# Patient Record
Sex: Female | Born: 1965 | Race: White | Hispanic: Yes | Marital: Single | Smoking: Former smoker
Health system: Southern US, Community
[De-identification: ages and names within clinical notes are randomized; demographics above are authoritative.]

## PROBLEM LIST (undated history)

## (undated) DIAGNOSIS — K2970 Gastritis, unspecified, without bleeding: Secondary | ICD-10-CM

## (undated) HISTORY — PX: CHOLECYSTECTOMY: SHX55

---

## 2010-08-15 ENCOUNTER — Emergency Department (HOSPITAL_COMMUNITY)
Admission: EM | Admit: 2010-08-15 | Discharge: 2010-08-15 | Payer: Self-pay | Source: Home / Self Care | Admitting: Family Medicine

## 2010-09-07 ENCOUNTER — Inpatient Hospital Stay (HOSPITAL_COMMUNITY)
Admission: AD | Admit: 2010-09-07 | Discharge: 2010-09-07 | Payer: Self-pay | Source: Home / Self Care | Attending: Family Medicine | Admitting: Family Medicine

## 2010-09-08 LAB — CBC
HCT: 39.5 % (ref 36.0–46.0)
Hemoglobin: 14 g/dL (ref 12.0–15.0)
MCH: 31.8 pg (ref 26.0–34.0)
MCHC: 35.4 g/dL (ref 30.0–36.0)
MCV: 89.8 fL (ref 78.0–100.0)
Platelets: 222 10*3/uL (ref 150–400)
RBC: 4.4 MIL/uL (ref 3.87–5.11)
RDW: 12.7 % (ref 11.5–15.5)
WBC: 7.9 10*3/uL (ref 4.0–10.5)

## 2010-09-08 LAB — URINE MICROSCOPIC-ADD ON

## 2010-09-08 LAB — URINALYSIS, ROUTINE W REFLEX MICROSCOPIC
Bilirubin Urine: NEGATIVE
Ketones, ur: NEGATIVE mg/dL
Leukocytes, UA: NEGATIVE
Nitrite: NEGATIVE
Protein, ur: NEGATIVE mg/dL
Specific Gravity, Urine: 1.02 (ref 1.005–1.030)
Urine Glucose, Fasting: NEGATIVE mg/dL
Urobilinogen, UA: 0.2 mg/dL (ref 0.0–1.0)
pH: 6 (ref 5.0–8.0)

## 2010-09-08 LAB — WET PREP, GENITAL
Clue Cells Wet Prep HPF POC: NONE SEEN
Trich, Wet Prep: NONE SEEN
Yeast Wet Prep HPF POC: NONE SEEN

## 2010-09-08 LAB — ABO/RH: ABO/RH(D): O POS

## 2010-09-08 LAB — POCT PREGNANCY, URINE: Preg Test, Ur: POSITIVE

## 2010-09-08 LAB — HCG, QUANTITATIVE, PREGNANCY: hCG, Beta Chain, Quant, S: 1539 m[IU]/mL — ABNORMAL HIGH (ref <5)

## 2010-09-09 ENCOUNTER — Inpatient Hospital Stay (HOSPITAL_COMMUNITY)
Admission: AD | Admit: 2010-09-09 | Discharge: 2010-09-09 | Payer: Self-pay | Source: Home / Self Care | Attending: Obstetrics & Gynecology | Admitting: Obstetrics & Gynecology

## 2010-09-10 LAB — GC/CHLAMYDIA PROBE AMP, GENITAL
Chlamydia, DNA Probe: NEGATIVE
GC Probe Amp, Genital: NEGATIVE

## 2010-09-10 LAB — HCG, QUANTITATIVE, PREGNANCY: hCG, Beta Chain, Quant, S: 187 m[IU]/mL — ABNORMAL HIGH (ref <5)

## 2010-09-15 ENCOUNTER — Ambulatory Visit (HOSPITAL_COMMUNITY): Admission: RE | Admit: 2010-09-15 | Payer: Self-pay | Source: Home / Self Care | Admitting: Family Medicine

## 2010-09-24 ENCOUNTER — Encounter: Payer: Self-pay | Admitting: Family Medicine

## 2010-09-25 ENCOUNTER — Encounter: Payer: Self-pay | Admitting: Obstetrics and Gynecology

## 2010-09-25 ENCOUNTER — Ambulatory Visit: Admit: 2010-09-25 | Payer: Self-pay | Admitting: Obstetrics and Gynecology

## 2010-11-22 ENCOUNTER — Inpatient Hospital Stay (INDEPENDENT_AMBULATORY_CARE_PROVIDER_SITE_OTHER)
Admission: RE | Admit: 2010-11-22 | Discharge: 2010-11-22 | Disposition: A | Discharge status: Home / Self Care | Payer: Self-pay | Source: Ambulatory Visit | Attending: Family Medicine | Admitting: Family Medicine

## 2010-11-22 DIAGNOSIS — M549 Dorsalgia, unspecified: Secondary | ICD-10-CM

## 2010-11-22 DIAGNOSIS — J309 Allergic rhinitis, unspecified: Secondary | ICD-10-CM

## 2011-05-28 ENCOUNTER — Inpatient Hospital Stay (HOSPITAL_COMMUNITY)
Admission: AD | Admit: 2011-05-28 | Discharge: 2011-05-28 | Disposition: A | Discharge status: Home / Self Care | Payer: Medicaid Other | Source: Ambulatory Visit | Attending: Obstetrics & Gynecology | Admitting: Obstetrics & Gynecology

## 2011-05-28 ENCOUNTER — Encounter (HOSPITAL_COMMUNITY): Payer: Self-pay | Admitting: *Deleted

## 2011-05-28 DIAGNOSIS — R51 Headache: Secondary | ICD-10-CM | POA: Insufficient documentation

## 2011-05-28 DIAGNOSIS — R1013 Epigastric pain: Secondary | ICD-10-CM | POA: Insufficient documentation

## 2011-05-28 HISTORY — DX: Gastritis, unspecified, without bleeding: K29.70

## 2011-05-28 LAB — COMPREHENSIVE METABOLIC PANEL
ALT: 12 U/L (ref 0–35)
AST: 13 U/L (ref 0–37)
Albumin: 4 g/dL (ref 3.5–5.2)
Alkaline Phosphatase: 86 U/L (ref 39–117)
BUN: 13 mg/dL (ref 6–23)
CO2: 27 mEq/L (ref 19–32)
Calcium: 9.3 mg/dL (ref 8.4–10.5)
Chloride: 100 mEq/L (ref 96–112)
Creatinine, Ser: 0.61 mg/dL (ref 0.50–1.10)
GFR calc Af Amer: >90 mL/min (ref >90)
GFR calc non Af Amer: >90 mL/min (ref >90)
Glucose, Bld: 94 mg/dL (ref 70–99)
Potassium: 4 mEq/L (ref 3.5–5.1)
Sodium: 137 mEq/L (ref 135–145)
Total Bilirubin: 0.3 mg/dL (ref 0.3–1.2)
Total Protein: 6.9 g/dL (ref 6.0–8.3)

## 2011-05-28 LAB — CBC
HCT: 40.8 % (ref 36.0–46.0)
Hemoglobin: 14.5 g/dL (ref 12.0–15.0)
MCH: 31.9 pg (ref 26.0–34.0)
MCHC: 35.5 g/dL (ref 30.0–36.0)
MCV: 89.7 fL (ref 78.0–100.0)
Platelets: 272 10*3/uL (ref 150–400)
RBC: 4.55 MIL/uL (ref 3.87–5.11)
RDW: 13.4 % (ref 11.5–15.5)
WBC: 11 10*3/uL — ABNORMAL HIGH (ref 4.0–10.5)

## 2011-05-28 MED ORDER — RANITIDINE HCL 150 MG PO CAPS: 150.0000 mg | ORAL_CAPSULE | Freq: Two times a day (BID) | ORAL | Status: DC

## 2011-05-28 MED ORDER — AMOXICILLIN 500 MG PO CAPS: 500.0000 mg | ORAL_CAPSULE | Freq: Three times a day (TID) | ORAL | Status: AC

## 2011-05-28 MED ORDER — FAMOTIDINE 20 MG PO TABS
20.0000 mg | ORAL_TABLET | Freq: Once | ORAL | Status: AC
  Administered 2011-05-28: 20 mg via ORAL
  Filled 2011-05-28: qty 1

## 2011-05-28 MED ORDER — OXYCODONE-ACETAMINOPHEN 5-325 MG PO TABS
1.0000 | ORAL_TABLET | Freq: Once | ORAL | Status: AC
  Administered 2011-05-28: 1 via ORAL
  Filled 2011-05-28: qty 1

## 2011-05-28 NOTE — ED Provider Notes (Signed)
History     CSN: [id number] Arrival date & time: 05/28/2011  7:24 PM  Chief Complaint  Patient presents with  . Headache  . Facial Pain    HPI Amber Crawford is a 45 y.o. female who presents to [facility] for headache and nasal congestion that started 3 days ago. She was told in the past that she has seasonal allergies. She also has epigastric pain that has been diagnosed in the past as gastritis and she was given Rx for acid blocker but never got the medication. She came from [place] a year ago and does not have a doctor in [place]. She has taken tylenol for her headache. Rates pain 7/10.  The history was provided by the patient using the hospital translator.  Past Medical History  Diagnosis Date  . Gastritis     Past Surgical History  Procedure Date  . Cesarean section     No family history on file.  History  Substance Use Topics  . Smoking status: Current Everyday Smoker -- 0.5 packs/day    Types: Cigarettes  . Smokeless tobacco: Not on file  . Alcohol Use: No    OB History    Grav Para Term Preterm Abortions TAB SAB Ect Mult Living   5    1 1    4       Review of Systems  Constitutional: Negative for fever, chills, diaphoresis and fatigue.  HENT: Positive for ear pain, congestion, sneezing and sinus pressure. Negative for sore throat, facial swelling, neck pain, neck stiffness and dental problem.   Eyes: Negative for photophobia, pain and discharge.  Respiratory: Negative for cough, chest tightness and wheezing.   Cardiovascular: Negative.   Gastrointestinal: Positive for abdominal pain. Negative for nausea, vomiting, diarrhea and constipation.  Genitourinary: Negative for dysuria, frequency, flank pain, vaginal bleeding, vaginal discharge, difficulty urinating, vaginal pain and pelvic pain.  Musculoskeletal: Negative for myalgias, back pain and gait problem.  Skin: Negative for color change and rash.  Neurological: Positive for light-headedness and headaches.  Negative for dizziness, speech difficulty, weakness and numbness.  Psychiatric/Behavioral: Negative for confusion and agitation. The patient is not nervous/anxious.     Allergies  Aspirin  Home Medications  No current outpatient prescriptions on file.  BP 139/87  Pulse 80  Temp(Src) 98.5 F (36.9 C) (Oral)  Resp 18  Ht 5' (1.524 m)  Wt 142 lb 8 oz (64.638 kg)  BMI 27.83 kg/m2  LMP 05/25/2011  Physical Exam  Nursing note and vitals reviewed. Constitutional: She is oriented to person, place, and time. She appears well-developed and well-nourished. No distress.  HENT:  Head: Normocephalic and atraumatic.  Right Ear: Tympanic membrane, external ear and ear canal normal.  Left Ear: Tympanic membrane, external ear and ear canal normal.  Nose: Right sinus exhibits maxillary sinus tenderness and frontal sinus tenderness.  Eyes: Conjunctivae and EOM are normal. Pupils are equal, round, and reactive to light.  Neck: Normal range of motion. Neck supple.  Cardiovascular: Normal rate and regular rhythm.   Pulmonary/Chest: Effort normal and breath sounds normal.  Abdominal: Soft. There is tenderness in the epigastric area. There is no rebound and no guarding.  Musculoskeletal: Normal range of motion.  Lymphadenopathy:    She has no cervical adenopathy.  Neurological: She is alert and oriented to person, place, and time. She has normal strength and normal reflexes. No cranial nerve deficit or sensory deficit. She displays a negative Romberg sign. Coordination and gait normal.  Skin: Skin is  warm and dry.  Psychiatric: She has a normal mood and affect.   Results for orders placed during the hospital encounter of 05/28/11 (from the past 24 hour(s))  CBC     Status: Abnormal   Collection Time   05/28/11  9:55 PM      Component Value Range   WBC 11.0 (*) 4.0 - 10.5 (K/uL)   RBC 4.55  3.87 - 5.11 (MIL/uL)   Hemoglobin 14.5  12.0 - 15.0 (g/dL)   HCT 86.5  78.4 - 69.6 (%)   MCV 89.7  78.0 -  100.0 (fL)   MCH 31.9  26.0 - 34.0 (pg)   MCHC 35.5  30.0 - 36.0 (g/dL)   RDW 29.5  28.4 - 13.2 (%)   Platelets 272  150 - 400 (K/uL)  COMPREHENSIVE METABOLIC PANEL     Status: Normal   Collection Time   05/28/11  9:55 PM      Component Value Range   Sodium 137  135 - 145 (mEq/L)   Potassium 4.0  3.5 - 5.1 (mEq/L)   Chloride 100  96 - 112 (mEq/L)   CO2 27  19 - 32 (mEq/L)   Glucose, Bld 94  70 - 99 (mg/dL)   BUN 13  6 - 23 (mg/dL)   Creatinine, Ser 4.40  0.50 - 1.10 (mg/dL)   Calcium 9.3  8.4 - 10.2 (mg/dL)   Total Protein 6.9  6.0 - 8.3 (g/dL)   Albumin 4.0  3.5 - 5.2 (g/dL)   AST 13  0 - 37 (U/L)   ALT 12  0 - 35 (U/L)   Alkaline Phosphatase 86  39 - 117 (U/L)   Total Bilirubin 0.3  0.3 - 1.2 (mg/dL)   GFR calc non Af Amer >90  >90 (mL/min)   GFR calc Af Amer >90  >90 (mL/min)   Assessment:  Sinus headache   Epigastric pain  Plan:  Percocet 5/325 mg. X 1 now.   Amoxicillin 500 mg. Po tid x 7 days   Pepcid 20 mg. Po now   Discussed with patient that if she continues to have problems she will need to go to [facility] or [facility] ED for      evaluation of problems other than OB/GYN.     ED Course: headache improved will d/c home.  Procedures    MDM          Kerrie Buffalo, NP 05/28/11 2307

## 2011-05-28 NOTE — Progress Notes (Signed)
Pt presents with complaints of Headache and sinus pain. Pt has a hx of sinus pain. No vaginal complaints.

## 2011-05-28 NOTE — Progress Notes (Signed)
WITH MADAY- INTERPRETER-  SAYS HEAD STARTED  HURTING X3 MTHS BEFORE CYCLE STARTS - ALSO HER EYES- PRESSURE, LOWER BACK  AND BREAST HURT.  WHEN HER CYCLE IS FINISHED - ALL SYM STOPS  EXCEPT  THIS TIME HER HEAD AND NOW  AND SINUSES STILL HUIRT.     ALSO  UPPER ABD HURTS.  TOLD BY DR  IN [place].- SHE HAS GASTRITIS.     [place]- HAS NOT SEEN ANY DR.

## 2011-06-02 NOTE — ED Provider Notes (Signed)
Attestation of Attending Supervision of Advanced Practitioner: Evaluation and management procedures were performed by the PA/NP/CNM/OB Fellow under my supervision/collaboration. Chart reviewed and agree with management and plan.  Elodia Haviland A 06/02/2011 1:20 PM   

## 2011-07-17 ENCOUNTER — Emergency Department (HOSPITAL_COMMUNITY)
Admission: EM | Admit: 2011-07-17 | Discharge: 2011-07-17 | Disposition: A | Discharge status: Home / Self Care | Payer: Medicaid Other | Source: Home / Self Care | Attending: Emergency Medicine | Admitting: Emergency Medicine

## 2011-07-17 ENCOUNTER — Encounter (HOSPITAL_COMMUNITY): Payer: Self-pay | Admitting: *Deleted

## 2011-07-17 DIAGNOSIS — M542 Cervicalgia: Secondary | ICD-10-CM

## 2011-07-17 DIAGNOSIS — J329 Chronic sinusitis, unspecified: Secondary | ICD-10-CM

## 2011-07-17 MED ORDER — CYCLOBENZAPRINE HCL 10 MG PO TABS: 10.0000 mg | ORAL_TABLET | Freq: Three times a day (TID) | ORAL | Status: AC | PRN

## 2011-07-17 MED ORDER — FLUTICASONE PROPIONATE 50 MCG/ACT NA SUSP: 2.0000 | Freq: Every day | NASAL | Status: DC

## 2011-07-17 MED ORDER — DOXYCYCLINE HYCLATE 100 MG PO CAPS: 100.0000 mg | ORAL_CAPSULE | Freq: Two times a day (BID) | ORAL | Status: AC

## 2011-07-17 MED ORDER — IBUPROFEN 600 MG PO TABS: 600.0000 mg | ORAL_TABLET | Freq: Four times a day (QID) | ORAL | Status: AC | PRN

## 2011-07-17 NOTE — ED Notes (Signed)
C/O facial pain and pressure w/ HA and neck pain since October; worse over past 2 wks.  Was given Rx for sinusitis 11/4, but was unable to get filled at that time.  C/O nausea, aching in upper back and entire neck.  Denies sore throat.  C/O sneezing and left earache.  Denies fevers.  C/O body aches and fatigue.  Denies cough.  C/O slight runny nose.  Has been taking Tyl or IBU w/ slight relief of HA.

## 2011-07-17 NOTE — ED Provider Notes (Signed)
History     CSN: [id number] Arrival date & time: 07/17/2011  6:21 PM   First MD Initiated Contact with Patient 07/17/11 1541      Chief Complaint  Patient presents with  . Facial Pain  . Headache  . Neck Pain   HPI Comments: Pt c/o 2 weeks worsening daily constant frontal sinus pressure/pain worse with bending forward, placing warm compresses on her face. Reports nasal congestion, postnasal drip, fatigue. No nasal discharge, sore throat. Also with sneezing, itchy watery eyes, left ear pain. No change in hearing, otorrhea. No dental pain, facial swelling. No N/V, fevers, photophobia, focal neurological deficits. Seen for this at [facility] on 10/4 sent home on amoxicillin but was unable to fill this. States sx never resolved.  Also c/o bilateral neck and upper back "soreness" during this time, worse with use, certain head positions. No recent/remote h/o trauma to neck, weakness/numbness of arms, parasthesias, rashes. States pain identical to pain she was seen for in march and thought to have MSK pain.   Patient is a 45 y.o. female presenting with sinusitis.  Sinusitis  This is a recurrent problem. The current episode started more than 1 week ago. The problem has been gradually worsening. There has been no fever. Associated symptoms include congestion, ear pain and sinus pressure. Pertinent negatives include no chills, no sore throat and no cough. She has tried acetaminophen (nsaids) for the symptoms. The treatment provided mild relief.    Past Medical History  Diagnosis Date  . Gastritis     Past Surgical History  Procedure Date  . Cesarean section     History reviewed. No pertinent family history.  History  Substance Use Topics  . Smoking status: Current Everyday Smoker -- 0.5 packs/day    Types: Cigarettes  . Smokeless tobacco: Not on file  . Alcohol Use: No    OB History    Grav Para Term Preterm Abortions TAB SAB Ect Mult Living   5    1 1    4       Review of Systems    Constitutional: Negative for fever and chills.  HENT: Positive for ear pain, congestion, sneezing, neck pain, postnasal drip and sinus pressure. Negative for hearing loss, sore throat, facial swelling, trouble swallowing, neck stiffness, dental problem, voice change and ear discharge.   Respiratory: Negative for cough and wheezing.   Cardiovascular: Negative for chest pain.  Musculoskeletal: Positive for myalgias. Negative for back pain.  Skin: Negative for wound.  Neurological: Positive for headaches. Negative for weakness.    Allergies  Aspirin  Home Medications   Current Outpatient Rx  Name Route Sig Dispense Refill  . CYCLOBENZAPRINE HCL 10 MG PO TABS Oral Take 1 tablet (10 mg total) by mouth 3 (three) times daily as needed for muscle spasms. 20 tablet 0  . DOXYCYCLINE HYCLATE 100 MG PO CAPS Oral Take 1 capsule (100 mg total) by mouth 2 (two) times daily. X 10 days 20 capsule 0  . FLUTICASONE PROPIONATE 50 MCG/ACT NA SUSP Nasal Place 2 sprays into the nose daily. 16 g 0  . IBUPROFEN 600 MG PO TABS Oral Take 1 tablet (600 mg total) by mouth every 6 (six) hours as needed for pain. 30 tablet 0    BP 135/67  Pulse 75  Temp(Src) 98.1 F (36.7 C) (Oral)  Resp 16  SpO2 98%  LMP 07/16/2011  Physical Exam  Nursing note and vitals reviewed. Constitutional: She is oriented to person, place, and time. She appears  well-developed and well-nourished. No distress.  HENT:  Head: Normocephalic and atraumatic.  Right Ear: Tympanic membrane normal.  Left Ear: Tympanic membrane normal.  Nose: Mucosal edema and rhinorrhea present. Right sinus exhibits maxillary sinus tenderness and frontal sinus tenderness. Left sinus exhibits maxillary sinus tenderness and frontal sinus tenderness.  Mouth/Throat: Uvula is midline, oropharynx is clear and moist and mucous membranes are normal.       Several upper, lower dental caries. No tooth tenderness, gingvial swelling.   Eyes: Conjunctivae and EOM are  normal. Pupils are equal, round, and reactive to light.  Neck: Normal range of motion.  Cardiovascular: Normal rate, regular rhythm and normal heart sounds.   Pulmonary/Chest: Effort normal and breath sounds normal.  Abdominal: She exhibits no distension.  Musculoskeletal: Normal range of motion.       Tenderness bilateral trapezial muscles, upper back. No c- or t-spine tenderness. UE with FROM, no tenderness in shoulders, sensation to LT intact, strength 5/5 bilaterally, RP 2+.  Lymphadenopathy:    She has no cervical adenopathy.  Neurological: She is alert and oriented to person, place, and time. She has normal strength. No cranial nerve deficit or sensory deficit.       Gait steady.   Skin: Skin is warm and dry. No rash noted.  Psychiatric: She has a normal mood and affect. Her behavior is normal. Judgment and thought content normal.    ED Course  Procedures (including critical care time)  Labs Reviewed - No data to display No results found.   1. Sinusitis   2. Neck pain, musculoskeletal       MDM  Pt with daily constant frontal HA c/w prev sinus HA. no sudden onset. Doubt SAH, ICH or space occupying lesion. Pt without fevers/chills, Pt has no meningeal sx, no nuchal rigidity. Doubt meningitis. Pt with normal neuro exam, no evidence of CVA/TIA.  Pt BP not elevated significantly, doubt hypertensive emergency. No evidence of temporal artery tenderness, no evidence of glaucoma or other occular pathology. Appears to be most c/w sinusitis- as had has sx for 7 weeks will tx with abx. Discussed importance of filling them and taking them as written. Neck/upper back pain appears to be muscle strain/spasm. Neuro exam WNL. Will have f/u with PMD of choice. Advised when to return to ED.      Luiz Blare, MD 07/17/11 [phone]

## 2012-02-13 ENCOUNTER — Emergency Department (HOSPITAL_COMMUNITY)
Admission: EM | Admit: 2012-02-13 | Discharge: 2012-02-13 | Disposition: A | Discharge status: Home / Self Care | Payer: Medicaid Other | Attending: Emergency Medicine | Admitting: Emergency Medicine

## 2012-02-13 ENCOUNTER — Emergency Department (HOSPITAL_COMMUNITY): Payer: Medicaid Other

## 2012-02-13 ENCOUNTER — Encounter (HOSPITAL_COMMUNITY): Payer: Self-pay | Admitting: *Deleted

## 2012-02-13 DIAGNOSIS — R059 Cough, unspecified: Secondary | ICD-10-CM | POA: Insufficient documentation

## 2012-02-13 DIAGNOSIS — R05 Cough: Secondary | ICD-10-CM | POA: Insufficient documentation

## 2012-02-13 DIAGNOSIS — R0602 Shortness of breath: Secondary | ICD-10-CM | POA: Insufficient documentation

## 2012-02-13 DIAGNOSIS — F17200 Nicotine dependence, unspecified, uncomplicated: Secondary | ICD-10-CM | POA: Insufficient documentation

## 2012-02-13 DIAGNOSIS — Z79899 Other long term (current) drug therapy: Secondary | ICD-10-CM | POA: Insufficient documentation

## 2012-02-13 DIAGNOSIS — J40 Bronchitis, not specified as acute or chronic: Secondary | ICD-10-CM

## 2012-02-13 MED ORDER — ALBUTEROL SULFATE (5 MG/ML) 0.5% IN NEBU
5.0000 mg | INHALATION_SOLUTION | Freq: Once | RESPIRATORY_TRACT | Status: AC
  Administered 2012-02-13: 5 mg via RESPIRATORY_TRACT
  Filled 2012-02-13: qty 1

## 2012-02-13 MED ORDER — ALBUTEROL SULFATE HFA 108 (90 BASE) MCG/ACT IN AERS
2.0000 | INHALATION_SPRAY | RESPIRATORY_TRACT | Status: DC | PRN
  Administered 2012-02-13: 2 via RESPIRATORY_TRACT
  Filled 2012-02-13: qty 6.7

## 2012-02-13 MED ORDER — DEXAMETHASONE SODIUM PHOSPHATE 10 MG/ML IJ SOLN
10.0000 mg | Freq: Once | INTRAMUSCULAR | Status: DC
  Filled 2012-02-13: qty 1

## 2012-02-13 MED ORDER — IPRATROPIUM BROMIDE 0.02 % IN SOLN
0.5000 mg | Freq: Once | RESPIRATORY_TRACT | Status: AC
  Administered 2012-02-13: 0.5 mg via RESPIRATORY_TRACT
  Filled 2012-02-13: qty 2.5

## 2012-02-13 MED ORDER — DEXAMETHASONE SODIUM PHOSPHATE 10 MG/ML IJ SOLN
10.0000 mg | Freq: Once | INTRAMUSCULAR | Status: AC
  Administered 2012-02-13: 10 mg via INTRAMUSCULAR

## 2012-02-13 NOTE — ED Provider Notes (Signed)
History     CSN: [id number]  Arrival date & time 02/13/12  0031   First MD Initiated Contact with Patient 02/13/12 0254      Chief Complaint  Patient presents with  . Shortness of Breath    (Consider location/radiation/quality/duration/timing/severity/associated sxs/prior treatment) HPI This is a 46 year old Hispanic female with a two-week history of dry cough, wheezing and shortness of breath. The symptoms are moderate and persistent. She was treated with Zithromax and Phenergan with codeine without adequate relief. Her symptoms are exacerbated by exertion such as when she is cleaning the house. She was given albuterol breathing treatment prior to my evaluation with significant improvement in her symptoms. She denies fever, nausea, vomiting or diarrhea. He denies chest pain.   Past Medical History  Diagnosis Date  . Gastritis     Past Surgical History  Procedure Date  . Cesarean section     History reviewed. No pertinent family history.  History  Substance Use Topics  . Smoking status: Current Everyday Smoker -- 0.5 packs/day    Types: Cigarettes  . Smokeless tobacco: Not on file  . Alcohol Use: No    OB History    Grav Para Term Preterm Abortions TAB SAB Ect Mult Living   5    1 1    4       Review of Systems  All other systems reviewed and are negative.    Allergies  Aspirin  Home Medications   Current Outpatient Rx  Name Route Sig Dispense Refill  . AZITHROMYCIN 500 MG PO TABS Oral Take 500 mg by mouth daily. For 5 days. Started 02/10/12    . IBUPROFEN 200 MG PO TABS Oral Take 600 mg by mouth every 8 (eight) hours as needed. For pain    . OMEGA-3-ACID ETHYL ESTERS 1 G PO CAPS Oral Take 1 g by mouth daily.    Marland Kitchen PRAVASTATIN SODIUM 20 MG PO TABS Oral Take 20 mg by mouth daily.    Marland Kitchen PROMETHAZINE-CODEINE 6.25-10 MG/5ML PO SYRP Oral Take 5 mLs by mouth 3 (three) times daily as needed.     Marland Kitchen FLUTICASONE PROPIONATE 50 MCG/ACT NA SUSP Nasal Place 2 sprays into  the nose daily. 16 g 0    BP 123/73  Pulse 76  Temp 98.1 F (36.7 C) (Oral)  Resp 17  Wt 135 lb (61.236 kg)  SpO2 95%  LMP 02/08/2012  Physical Exam General: Well-developed, well-nourished female in no acute distress; appearance consistent with age of record HENT: normocephalic, atraumatic Eyes: pupils equal round and reactive to light; extraocular muscles intact Neck: supple Heart: regular rate and rhythm Lungs: Expiratory wheezing bilaterally Abdomen: soft; nondistended Extremities: No deformity; full range of motion Neurologic: Awake, alert and oriented; motor function intact in all extremities and symmetric; no facial droop Skin: Warm and dry Psychiatric: Normal mood and affect    ED Course  Procedures (including critical care time)     MDM   Nursing notes and vitals signs, including pulse oximetry, reviewed.  Summary of this visit's results, reviewed by myself:  Imaging Studies: Dg Chest 2 View  02/13/2012  *RADIOLOGY REPORT*  Clinical Data: Shortness of breath, cough, congestion and wheezing; history of smoking.  CHEST - 2 VIEW  Comparison: None.  Findings: The lungs are well-aerated.  Mild peribronchial thickening is noted.  There is no evidence of focal opacification, pleural effusion or pneumothorax.  The heart is normal in size; the mediastinal contour is within normal limits.  No acute osseous abnormalities  are seen.  Clips are noted within the right upper quadrant, reflecting prior cholecystectomy.  IMPRESSION: Mild peribronchial thickening noted; lungs otherwise clear.  Original Report Authenticated By: Tonia Ghent, M.D.            Hanley Seamen, MD 02/13/12 [phone]

## 2012-02-13 NOTE — ED Notes (Signed)
Pt c/o s/s x 2 weeks. Pt presents w/ dry cough, audible inspiratory wheeze

## 2012-02-13 NOTE — ED Notes (Signed)
Pt seen this week for cough, rx'd Zpak, last dose due tomorrow per EMS. Pt c/o cough and "trouble breathing". Pt has taken rx'd cough medication today w/o relief. Pt is taking promethazine/codeine syrup.

## 2012-02-13 NOTE — ED Notes (Signed)
Patient is alert and oriented x3.  She was given DC instructions and follow up visit instructions.  Patient gave verbal understanding. She was DC ambulatory under his own power to home.  V/S stable.  He was not showing any signs of distress on DC 

## 2014-06-25 ENCOUNTER — Encounter (HOSPITAL_COMMUNITY): Payer: Self-pay | Admitting: *Deleted

## 2015-06-27 ENCOUNTER — Emergency Department (HOSPITAL_COMMUNITY)
Admission: EM | Admit: 2015-06-27 | Discharge: 2015-06-28 | Disposition: A | Discharge status: Home / Self Care | Payer: Medicaid Other | Attending: Emergency Medicine | Admitting: Emergency Medicine

## 2015-06-27 ENCOUNTER — Encounter (HOSPITAL_COMMUNITY): Payer: Self-pay | Admitting: Emergency Medicine

## 2015-06-27 DIAGNOSIS — R42 Dizziness and giddiness: Secondary | ICD-10-CM | POA: Insufficient documentation

## 2015-06-27 DIAGNOSIS — Z7951 Long term (current) use of inhaled steroids: Secondary | ICD-10-CM | POA: Insufficient documentation

## 2015-06-27 DIAGNOSIS — R110 Nausea: Secondary | ICD-10-CM | POA: Diagnosis not present

## 2015-06-27 DIAGNOSIS — Z79899 Other long term (current) drug therapy: Secondary | ICD-10-CM | POA: Diagnosis not present

## 2015-06-27 DIAGNOSIS — Z792 Long term (current) use of antibiotics: Secondary | ICD-10-CM | POA: Insufficient documentation

## 2015-06-27 DIAGNOSIS — M79641 Pain in right hand: Secondary | ICD-10-CM | POA: Diagnosis not present

## 2015-06-27 DIAGNOSIS — Z8719 Personal history of other diseases of the digestive system: Secondary | ICD-10-CM | POA: Diagnosis not present

## 2015-06-27 DIAGNOSIS — R531 Weakness: Secondary | ICD-10-CM | POA: Insufficient documentation

## 2015-06-27 LAB — URINALYSIS, ROUTINE W REFLEX MICROSCOPIC
Bilirubin Urine: NEGATIVE
Glucose, UA: NEGATIVE mg/dL
Hgb urine dipstick: NEGATIVE
Ketones, ur: NEGATIVE mg/dL
LEUKOCYTES UA: NEGATIVE
NITRITE: NEGATIVE
Protein, ur: NEGATIVE mg/dL
SPECIFIC GRAVITY, URINE: 1.02 (ref 1.005–1.030)
UROBILINOGEN UA: 1 mg/dL (ref 0.0–1.0)
pH: 7.5 (ref 5.0–8.0)

## 2015-06-27 LAB — BASIC METABOLIC PANEL
ANION GAP: 12 (ref 5–15)
BUN: 13 mg/dL (ref 6–20)
CO2: 23 mmol/L (ref 22–32)
Calcium: 8.9 mg/dL (ref 8.9–10.3)
Chloride: 101 mmol/L (ref 101–111)
Creatinine, Ser: 0.6 mg/dL (ref 0.44–1.00)
GFR calc Af Amer: >60 mL/min (ref >60)
GLUCOSE: 95 mg/dL (ref 65–99)
POTASSIUM: 4.1 mmol/L (ref 3.5–5.1)
Sodium: 136 mmol/L (ref 135–145)

## 2015-06-27 LAB — CBC
HEMATOCRIT: 38.3 % (ref 36.0–46.0)
HEMOGLOBIN: 13.2 g/dL (ref 12.0–15.0)
MCH: 31.4 pg (ref 26.0–34.0)
MCHC: 34.5 g/dL (ref 30.0–36.0)
MCV: 91.2 fL (ref 78.0–100.0)
Platelets: 250 10*3/uL (ref 150–400)
RBC: 4.2 MIL/uL (ref 3.87–5.11)
RDW: 13 % (ref 11.5–15.5)
WBC: 9.4 10*3/uL (ref 4.0–10.5)

## 2015-06-27 MED ORDER — METOCLOPRAMIDE HCL 10 MG PO TABS
10.0000 mg | ORAL_TABLET | Freq: Once | ORAL | Status: DC
Start: 2015-06-27 — End: 2015-06-28

## 2015-06-27 MED ORDER — MECLIZINE HCL 25 MG PO TABS
50.0000 mg | ORAL_TABLET | Freq: Once | ORAL | Status: AC
Start: 2015-06-27 — End: 2015-06-28
  Administered 2015-06-28: 50 mg via ORAL
  Filled 2015-06-27: qty 2

## 2015-06-27 MED ORDER — IBUPROFEN 400 MG PO TABS
600.0000 mg | ORAL_TABLET | Freq: Once | ORAL | Status: DC
  Filled 2015-06-27 (×2): qty 1

## 2015-06-27 NOTE — ED Notes (Signed)
Pt states for the past 5 days she has been dizzy and having intermittent double vision. No double vision at this time but it comes and goes. Pt also has anterior headache for past 5 days. Pt deines any chest pain. Pt is alert and ox4. No neuro deficits noted.

## 2015-06-27 NOTE — ED Notes (Signed)
Pt states LOC approx 2 day ago.

## 2015-06-27 NOTE — ED Provider Notes (Signed)
CSN: [id number]   Arrival date & time 06/27/15 1821  History  By signing my name below, I, Bethel Born, attest that this documentation has been prepared under the direction and in the presence of Tomasita Crumble, MD. Electronically Signed: Bethel Born, ED Scribe. 06/27/2015. 11:56 PM.  Chief Complaint  Patient presents with  . Dizziness    HPI The history is provided by the patient. A language interpreter was used (#[id number]).   Amber Crawford is a 49 y.o. female who presents to the Emergency Department complaining of new and intermittent dizziness with onset 2 weeks ago. Turning the head from side to side exacerbates the sensation. Associated symptoms include generalized weakness, and nausea. Pt denies focal weakness, fever, headache, cough, chest pain, vomiting, diarrhea, abdominal pain. She also complains of shooting pain in her right hand for 8 months that inhibits her ability to work.  Pt speaks a small amount of English. History obtained with the use of a Spanish tele-interpreter.    Past Medical History  Diagnosis Date  . Gastritis     Past Surgical History  Procedure Laterality Date  . Cesarean section      No family history on file.  Social History  Substance Use Topics  . Smoking status: Current Every Day Smoker -- 0.50 packs/day    Types: Cigarettes  . Smokeless tobacco: None  . Alcohol Use: No     Review of Systems  Constitutional: Negative for fever and chills.  Respiratory: Negative for cough.   Gastrointestinal: Positive for nausea. Negative for vomiting and diarrhea.  Musculoskeletal:       Right hand pain  Neurological: Positive for dizziness and weakness (generalized). Negative for speech difficulty, numbness and headaches.  10 Systems reviewed and all are negative for acute change except as noted in the HPI. Home Medications   Prior to Admission medications   Medication Sig Start Date End Date Taking? Authorizing Provider  azithromycin  (ZITHROMAX) 500 MG tablet Take 500 mg by mouth daily. For 5 days. Started 02/10/12    Historical Provider, MD  fluticasone (FLONASE) 50 MCG/ACT nasal spray Place 2 sprays into the nose daily. 07/17/11 07/16/12  Domenick Gong, MD  ibuprofen (ADVIL,MOTRIN) 200 MG tablet Take 600 mg by mouth every 8 (eight) hours as needed. For pain    Historical Provider, MD  omega-3 acid ethyl esters (LOVAZA) 1 G capsule Take 1 g by mouth daily.    Historical Provider, MD  pravastatin (PRAVACHOL) 20 MG tablet Take 20 mg by mouth daily.    Historical Provider, MD  promethazine-codeine (PHENERGAN WITH CODEINE) 6.25-10 MG/5ML syrup Take 5 mLs by mouth 3 (three) times daily as needed.     Historical Provider, MD    Allergies  Aspirin  Triage Vitals: BP 100/86 mmHg  Pulse 77  Temp(Src) 98.2 F (36.8 C) (Oral)  Resp 16  Ht  (1.651 m)  Wt 135 lb (61.236 kg)  BMI 22.47 kg/m2  SpO2 100%  LMP 06/25/2015  Physical Exam  Constitutional: She is oriented to person, place, and time. She appears well-developed and well-nourished. No distress.  HENT:  Head: Normocephalic and atraumatic.  Nose: Nose normal.  Mouth/Throat: Oropharynx is clear and moist. No oropharyngeal exudate.  Eyes: Conjunctivae and EOM are normal. Pupils are equal, round, and reactive to light. No scleral icterus.  Neck: Normal range of motion. Neck supple. No JVD present. No tracheal deviation present. No thyromegaly present.  Cardiovascular: Normal rate, regular rhythm and normal heart sounds.  Exam reveals no gallop and no friction rub.   No murmur heard. Pulmonary/Chest: Effort normal and breath sounds normal. No respiratory distress. She has no wheezes. She exhibits no tenderness.  Abdominal: Soft. Bowel sounds are normal. She exhibits no distension and no mass. There is no tenderness. There is no rebound and no guarding.  Musculoskeletal: Normal range of motion. She exhibits no edema or tenderness.  Lymphadenopathy:    She has no  cervical adenopathy.  Neurological: She is alert and oriented to person, place, and time. No cranial nerve deficit. She exhibits normal muscle tone.  Normal strength and sensation in all extremities. Normal cerebellar testing.  Skin: Skin is warm and dry. No rash noted. No erythema. No pallor.  Nursing note and vitals reviewed.   ED Course  Procedures   DIAGNOSTIC STUDIES: Oxygen Saturation is 100% on RA, normal by my interpretation.    COORDINATION OF CARE: 11:29 PM Discussed treatment plan which includes lab work, Reglan, and meclizine with pt at bedside and pt agreed to plan.  Labs Reviewed  URINALYSIS, ROUTINE W REFLEX MICROSCOPIC (NOT AT [facility]) - Abnormal; Notable for the following:    APPearance CLOUDY (*)    All other components within normal limits  BASIC METABOLIC PANEL  CBC  HCG, SERUM, QUALITATIVE  CBG MONITORING, ED    Imaging Review No results found.  I personally reviewed and evaluated these lab results as a part of my medical decision-making.   EKG Interpretation  Date/Time:    Ventricular Rate:    PR Interval:    QRS Duration:   QT Interval:    QTC Calculation:   R Axis:     Text Interpretation:      MDM   Final diagnoses:  None   Patient presents to the ED for dizziness that appears to be worse with moving her head.  It has been going on for 2 weeks.  History is consistent with vertigo and not consistent with posterior stroke.  She was given toradol, reglan, and meclizine for treatment.  Advised on Epley maneuver for home.  She demonstrates understanding, appears well and is in no NAD.  VS remain within her normal limits and she is safe for DC.  I personally performed the services described in this documentation, which was scribed in my presence. The recorded information has been reviewed and is accurate.      Tomasita CrumbleAdeleke Haru Shaff, MD 06/28/15 [phone]

## 2015-06-28 LAB — HCG, SERUM, QUALITATIVE: PREG SERUM: NEGATIVE

## 2015-06-28 MED ORDER — METOCLOPRAMIDE HCL 5 MG/ML IJ SOLN
10.0000 mg | Freq: Once | INTRAMUSCULAR | Status: AC
  Administered 2015-06-28: 10 mg via INTRAVENOUS
  Filled 2015-06-28: qty 2

## 2015-06-28 MED ORDER — KETOROLAC TROMETHAMINE 30 MG/ML IJ SOLN
30.0000 mg | Freq: Once | INTRAMUSCULAR | Status: AC
  Administered 2015-06-28: 30 mg via INTRAVENOUS
  Filled 2015-06-28: qty 1

## 2015-06-28 MED ORDER — METOCLOPRAMIDE HCL 10 MG PO TABS: 10.0000 mg | ORAL_TABLET | Freq: Three times a day (TID) | ORAL | Status: DC | PRN

## 2015-06-28 NOTE — Discharge Instructions (Signed)
Maniobra de Epley, cuidado personal Merchandiser, retail Self-Care) Amber Crawford, your blood work today is normal.  You can use the epley maneuver at home to help with your symptoms.  You can search for a video on the internet.  Take reglan as well for symptoms.  See a primary care doctor within 3 days for close follow up.  If any symptoms worsen, come back to the ED immediately. Thank you.   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 Puede utilizar la [place] de Epley en casa para ayudar con sus sntomas. Puede buscar un vdeo en Internet. Tome Reglan, as como para los sntomas. Consulte a un mdico de atencin primaria dentro de los [address]  [address] de un seguimiento Museum/gallery exhibitions officer. Si los sntomas empeoran, volver al servicio de urgencias inmediatamente. Gracias.  QU ES LA MANIOBRA DE EPLEY? La [employer] de Epley es un ejercicio que puede Education officer, environmental para Paramedic los sntomas del vrtigo posicional paroxstico benigno (VPPB). Esta afeccin a menudo se conoce como vrtigo. El movimiento de unos pequeos cristales (canalculos) dentro del odo interno ocasiona el VPPB. La acumulacin y el movimiento de los canalculos en el odo interno ocasionan una repentina sensacin de aceleracin (vrtigo) cuando se mueve la cabeza en ciertas posiciones. El vrtigo por lo general dura unos 30das. El VPPB normalmente ocurre solo en un odo. Si siente vrtigo cuando se recuesta sobre el lado izquierdo, probablemente tenga VPPB en el odo izquierdo. El mdico le puede decir qu odo est involucrado.  Una lesin en la cabeza puede ocasionar el VPPB. Muchas personas de ms de 50aos tienen VPPB por motivos desconocidos. Si se le diagnostic VPPB, el mdico puede ensearle a Firefighter. El VPPB no es potencialmente mortal (benigno) y normalmente se pasa con el tiempo.  CUNDO DEBO REALIZAR LA MANIOBRA DE EPLEY? Puede realizar [employer] en su casa cuando tenga los sntomas de vrtigo. Puede  realizar Musician de Epley hasta 3veces en un da hasta que los sntomas de vrtigo desaparezcan. CMO DEBO REALIZAR LA MANIOBRA DE EPLEY? 1. Sintese en el borde de una cama o una mesa con la espalda recta. Las piernas deben estar extendidas o colgando sobre el borde de la cama o la mesa. 2. Gire la cabeza a medias hacia el lado del odo afectado. 3. Recustese hacia atrs con la cabeza girada hasta que se encuentre recostado sobre la espalda. Quizs quiera colocar una almohada debajo de los hombros. 4. Mantenga esta posicin durante 30segundos. Es posible que experimente un ataque de vrtigo. Esto es normal. Mantenga esta posicin hasta que el vrtigo desaparezca. 5. Luego gire la cabeza en direccin opuesta hasta que el odo no afectado est orientado al suelo. 6. Mantenga esta posicin durante 30segundos. Es posible que experimente un ataque de vrtigo. Esto es normal. Mantenga esta posicin hasta que el vrtigo desaparezca. 7. Ahora gire todo el cuerpo hacia el mismo lado que la cabeza. Mantenga esta posicin durante otros 30segundos. 8. Luego, vuelva a sentarse. ESTA MANIOBRA PRESENTA RIESGOS? En algunos casos, puede tener otros sntomas (como cambios en la visin, debilidad o entumecimiento). Si tiene estos sntomas, deje de Clinical cytogeneticist y llame al mdico. Baron Sane si realizar esta maniobra lo [place] del vrtigo, es posible que sienta mareos. El mareo es la sensacin de desvanecimiento pero sin la sensacin de [place]. Aunque la [employer] de Energy manager lo [place] del vrtigo, es posible que los sntomas vuelvan durante los siguientes 5aos. QU DEBO HACER DESPUS DE ESTA MANIOBRA? Puede  retomar sus actividades normales despus de Education officer, environmentalrealizar la [place] View Gardensmaniobra de Energy managerpley. Pregntele al mdico si debe hacer algo en su casa para prevenir el vrtigo. Esta puede incluir:  Dormir con dos o ms almohadas para Pharmacologistmantener la cabeza elevada.  No dormir sobre el lado del odo afectado.  Levantarse  lentamente de la cama.  Evitar los movimientos repentinos Administratordurante el da.  Evitar los movimientos de cabeza intensos, como mirar hacia arriba o Public librarianagacharse.  Utilizar un collar cervical para evitar los movimientos de cabeza repentinos. QU DEBO HACER SI LOS SNTOMAS EMPEORAN? Llame al mdico si el vrtigo empeora. Llame al mdico inmediatamente si tiene otros sntomas, incluidos:   Nuseas.  Vmitos.  Dolor de [place]istancabeza.  Debilidad.  Entumecimiento.  Cambios en la visin.   Esta informacin no tiene Theme park managercomo fin reemplazar el consejo del mdico. Asegrese de hacerle al mdico cualquier pregunta que tenga.   Document Released: 08/15/2013 Elsevier Interactive Patient Education 2016 [employer] [place]Mareos (Dizziness) Los mareos son un problema muy frecuente. Causan sensacin de inestabilidad o de desvanecimiento. Puede sentir que se va a desmayar. Un mareo puede provocarle una lesin si se tropieza o se cae. Cualquier persona puede marearse, pero los Farib[place]mareos son ms frecuentes en los ONEOKadultos mayores. Esta afeccin puede tener muchas causas, por ejemplo: 9. Medicamentos. 10. Deshidratacin. 11. Enfermedad. CUIDADOS EN EL HOGAR Estas indicaciones pueden ayudarlo con el trastorno: Comida y bebida  Beba suficiente lquido para Pharmacologistmantener el pis (orina) claro o de color amarillo plido. Esto evita la deshidratacin. Trate de beber ms lquidos transparentes, como agua.  No beba alcohol.  Limite la cantidad de cafena que bebe o come si el mdico se lo indic.  Limite la cantidad de sal que bebe o come si el mdico se lo indic. Actividad  Evite los movimientos rpidos.  Cuando se levante de una silla, sujtese hasta sentirse bien.  Por la maana, sintese primero a un lado de la cama. Cuando se sienta bien, pngase lentamente de [address] mientras se sostiene de algo, hasta que sepa que ha logrado el equilibrio.  Mueva las piernas con frecuencia si debe estar de pie en un lugar durante mucho  tiempo. Mientras est de pie, contraiga y relaje los msculos de las piernas.  No conduzca vehculos ni utilice maquinarias pesadas si se siente mareado.  Evite agacharse si se siente mareado. En su casa, coloque los objetos de modo que le resulte fcil alcanzarlos sin Public librarianagacharse. Estilo de vida  No consuma ningn producto que contenga tabaco, lo que incluye cigarrillos, tabaco de Theatre managermascar o Administrator, Civil Servicecigarrillos electrnicos. Si necesita ayuda para dejar de fumar, consulte al American Expressmdico.  Trate de reducir el nivel de estrs practicando actividades como el yoga o la meditacin. Hable con el mdico si necesita ayuda. Instrucciones generales  Controle sus mareos para ver si hay cambios.  Tome los medicamentos solamente como se lo haya indicado el mdico. Hable con el mdico si cree que algn medicamento que est tomando es la causa de sus Bryanmareos.  Infrmele a un amigo o a un familiar si se siente mareado. Pdale a esta persona que llame al mdico si observa cambios en su comportamiento.  Concurra a todas las visitas de control como se lo haya indicado el mdico. Esto es importante. SOLICITE AYUDA SI:  Los American Expressmareos persisten.  Los Golden West Financialmareos o la sensacin de Production assistant, radiodesvanecimiento empeoran.  Siente malestar estomacal (nuseas).  Tiene problemas para escuchar.  Aparecen nuevos sntomas.  Cuando est de pie se siente inestable o que la habitacin  da vueltas. SOLICITE AYUDA DE INMEDIATO SI:  Vomita o tiene diarrea y no puede comer ni beber nada.  Tiene dificultad para lo siguiente:  Hablar.  Caminar.  Tragar.  Usar los brazos, las manos o las piernas.  Siente una debilidad generalizada.  No piensa con claridad o tiene dificultades para armar oraciones. Es posible que un amigo o un familiar adviertan que esto ocurre.  Tiene los siguientes sntomas:  Journalist, newspaper.  Dolor en el vientre (abdomen).  Falta de aire.  Sudoracin.  Cambios en la visin.  Hemorragias.  Dolores de  [place].  Dolor o rigidez en el cuello.  Grant Ruts.   Esta informacin no tiene Theme park manager el consejo del mdico. Asegrese de hacerle al mdico cualquier pregunta que tenga.   Document Released: 07/30/2011 Document Revised: 12/25/2014 Elsevier Interactive Patient Education [employer]

## 2017-01-01 ENCOUNTER — Emergency Department (HOSPITAL_COMMUNITY): Payer: Self-pay

## 2017-01-01 ENCOUNTER — Other Ambulatory Visit (HOSPITAL_COMMUNITY): Payer: Medicaid Other

## 2017-01-01 ENCOUNTER — Emergency Department (HOSPITAL_COMMUNITY)
Admission: EM | Admit: 2017-01-01 | Discharge: 2017-01-02 | Disposition: A | Discharge status: Home / Self Care | Payer: Self-pay | Attending: Emergency Medicine | Admitting: Emergency Medicine

## 2017-01-01 ENCOUNTER — Encounter (HOSPITAL_COMMUNITY): Payer: Self-pay

## 2017-01-01 DIAGNOSIS — F17210 Nicotine dependence, cigarettes, uncomplicated: Secondary | ICD-10-CM | POA: Insufficient documentation

## 2017-01-01 DIAGNOSIS — R102 Pelvic and perineal pain: Secondary | ICD-10-CM | POA: Insufficient documentation

## 2017-01-01 DIAGNOSIS — R109 Unspecified abdominal pain: Secondary | ICD-10-CM

## 2017-01-01 LAB — WET PREP, GENITAL
SPERM: NONE SEEN
TRICH WET PREP: NONE SEEN
Yeast Wet Prep HPF POC: NONE SEEN

## 2017-01-01 LAB — COMPREHENSIVE METABOLIC PANEL
ALK PHOS: 68 U/L (ref 38–126)
ALT: 9 U/L — AB (ref 14–54)
AST: 19 U/L (ref 15–41)
Albumin: 3.6 g/dL (ref 3.5–5.0)
Anion gap: 9 (ref 5–15)
BUN: 12 mg/dL (ref 6–20)
CALCIUM: 9.1 mg/dL (ref 8.9–10.3)
CO2: 23 mmol/L (ref 22–32)
CREATININE: 0.66 mg/dL (ref 0.44–1.00)
Chloride: 105 mmol/L (ref 101–111)
Glucose, Bld: 144 mg/dL — ABNORMAL HIGH (ref 65–99)
Potassium: 3.4 mmol/L — ABNORMAL LOW (ref 3.5–5.1)
SODIUM: 137 mmol/L (ref 135–145)
TOTAL PROTEIN: 6 g/dL — AB (ref 6.5–8.1)
Total Bilirubin: 0.3 mg/dL (ref 0.3–1.2)

## 2017-01-01 LAB — CBC
HCT: 37.7 % (ref 36.0–46.0)
Hemoglobin: 13.1 g/dL (ref 12.0–15.0)
MCH: 31.3 pg (ref 26.0–34.0)
MCHC: 34.7 g/dL (ref 30.0–36.0)
MCV: 90 fL (ref 78.0–100.0)
PLATELETS: 272 10*3/uL (ref 150–400)
RBC: 4.19 MIL/uL (ref 3.87–5.11)
RDW: 13.6 % (ref 11.5–15.5)
WBC: 9.2 10*3/uL (ref 4.0–10.5)

## 2017-01-01 LAB — LIPASE, BLOOD: LIPASE: 28 U/L (ref 11–51)

## 2017-01-01 LAB — URINALYSIS, ROUTINE W REFLEX MICROSCOPIC
Bilirubin Urine: NEGATIVE
GLUCOSE, UA: NEGATIVE mg/dL
KETONES UR: NEGATIVE mg/dL
LEUKOCYTES UA: NEGATIVE
Nitrite: NEGATIVE
PH: 5 (ref 5.0–8.0)
Protein, ur: NEGATIVE mg/dL
SPECIFIC GRAVITY, URINE: 1.024 (ref 1.005–1.030)

## 2017-01-01 MED ORDER — IBUPROFEN 600 MG PO TABS: 600.0000 mg | ORAL_TABLET | Freq: Four times a day (QID) | ORAL | 0 refills | Status: DC | PRN

## 2017-01-01 NOTE — Discharge Instructions (Signed)
Please read and follow all provided instructions.  Your diagnoses today include:  1. Pelvic pain   2. Abdominal pain     Tests performed today include:  Blood counts and electrolytes - normal  Blood tests to check liver and kidney function  Urine test to look for infection - some blood in urine but no sign of infection  Test for vaginal infection  Ultrasound - shows slightly thick lining of the uterus, let your doctor know about this  Vital signs. See below for your results today.   Medications prescribed:   Ibuprofen (Motrin, Advil) - anti-inflammatory pain medication  Do not exceed 600mg  ibuprofen every 6 hours, take with food  You have been prescribed an anti-inflammatory medication or NSAID. Take with food. Take smallest effective dose for the shortest duration needed for your pain. Stop taking if you experience stomach pain or vomiting.   Take any prescribed medications only as directed.  Home care instructions:   Follow any educational materials contained in this packet.  Follow-up instructions: Please follow-up with the GYN referral for further evaluation of your symptoms.   Return instructions:  SEEK IMMEDIATE MEDICAL ATTENTION IF:  The pain does not go away or becomes severe   A temperature above 101F develops   Repeated vomiting occurs (multiple episodes)   The pain becomes localized to portions of the abdomen. The right side could possibly be appendicitis. In an adult, the left lower portion of the abdomen could be colitis or diverticulitis.   Blood is being passed in stools or vomit (bright red or black tarry stools)   You develop chest pain, difficulty breathing, dizziness or fainting, or become confused, poorly responsive, or inconsolable (young children)  If you have any other emergent concerns regarding your health  Additional Information: Abdominal (belly) pain can be caused by many things. Your caregiver performed an examination and possibly  ordered blood/urine tests and imaging (CT scan, x-rays, ultrasound). Many cases can be observed and treated at home after initial evaluation in the emergency department. Even though you are being discharged home, abdominal pain can be unpredictable. Therefore, you need a repeated exam if your pain does not resolve, returns, or worsens. Most patients with abdominal pain don't have to be admitted to the hospital or have surgery, but serious problems like appendicitis and gallbladder attacks can start out as nonspecific pain. Many abdominal conditions cannot be diagnosed in one visit, so follow-up evaluations are very important.  Your vital signs today were: BP 120/60 (BP Location: Left Arm)    Pulse 71    Temp 98.6 F (37 C) (Oral)    Resp 17    Wt 57.8 kg    LMP 12/08/2016 (Within Days)    SpO2 97%    BMI 21.22 kg/m  If your blood pressure (bp) was elevated above 135/85 this visit, please have this repeated by your doctor within one month. --------------

## 2017-01-01 NOTE — ED Provider Notes (Signed)
[facility]-EMERGENCY DEPT Provider Note   CSN: [id number] Arrival date & time: 01/01/17  1608     History   Chief Complaint Chief Complaint  Patient presents with  . Abdominal Pain    HPI Amber Crawford is a 51 y.o. female.  Patient with history of cesarean section presents with complaints of pelvic pain. 29 yesterday she developed pain across her lower abdomen and pelvis. Pain slightly worse midline into the right. She describes the pain as "drawing". She has slight discomfort with urination but no hematuria. No vaginal bleeding or discharge reported. No fevers, vomiting, or diarrhea. Pain is worse with movement and palpation. No other previous abdominal surgeries. No treatments prior to arrival. Onset of symptoms again. Course is constant. Nothing makes symptoms better.      Past Medical History:  Diagnosis Date  . Gastritis     There are no active problems to display for this patient.   Past Surgical History:  Procedure Laterality Date  . CESAREAN SECTION      OB History    Gravida Para Term Preterm AB Living   5       1 4    SAB TAB Ectopic Multiple Live Births     1             Home Medications    Prior to Admission medications   Medication Sig Start Date End Date Taking? Authorizing Provider  fluticasone (FLONASE) 50 MCG/ACT nasal spray Place 2 sprays into the nose daily. Patient not taking: Reported on 06/27/2015 07/17/11 07/16/12  Domenick GongMortenson, Ashley, MD  metoCLOPramide (REGLAN) 10 MG tablet Take 1 tablet (10 mg total) by mouth every 8 (eight) hours as needed (dizziness and nausea). 06/28/15   Tomasita Crumbleni, Adeleke, MD    Family History History reviewed. No pertinent family history.  Social History Social History  Substance Use Topics  . Smoking status: Current Every Day Smoker    Packs/day: 0.50    Types: Cigarettes  . Smokeless tobacco: Never Used  . Alcohol use No     Allergies   Aspirin   Review of Systems Review of Systems  Constitutional:  Negative for fever.  HENT: Negative for rhinorrhea and sore throat.   Eyes: Negative for redness.  Respiratory: Negative for cough.   Cardiovascular: Negative for chest pain.  Gastrointestinal: Positive for abdominal pain. Negative for diarrhea, nausea and vomiting.  Genitourinary: Positive for dysuria and pelvic pain. Negative for hematuria, vaginal bleeding and vaginal discharge.  Musculoskeletal: Negative for myalgias.  Skin: Negative for rash.  Neurological: Negative for headaches.     Physical Exam Updated Vital Signs BP 120/60 (BP Location: Left Arm)   Pulse 71   Temp 98.6 F (37 C) (Oral)   Resp 17   Wt 57.8 kg   LMP 12/08/2016 (Within Days)   SpO2 97%   BMI 21.22 kg/m   Physical Exam  Constitutional: She appears well-developed and well-nourished.  HENT:  Head: Normocephalic and atraumatic.  Eyes: Conjunctivae are normal. Right eye exhibits no discharge. Left eye exhibits no discharge.  Neck: Normal range of motion. Neck supple.  Cardiovascular: Normal rate, regular rhythm and normal heart sounds.   Pulmonary/Chest: Effort normal and breath sounds normal. No respiratory distress. She has no wheezes. She has no rales.  Abdominal: Soft. There is tenderness (Mild, bilateral low abdomen/pelvis). There is no rebound and no guarding.  Genitourinary: Pelvic exam was performed with patient supine. There is no rash or lesion on the right labia. There is  no rash or lesion on the left labia. Uterus is not tender. Cervix exhibits no motion tenderness and no discharge. Right adnexum displays no mass, no tenderness and no fullness. Left adnexum displays no mass, no tenderness and no fullness. No erythema, tenderness or bleeding in the vagina. Vaginal discharge (Scant white) found.  Neurological: She is alert.  Skin: Skin is warm and dry.  Psychiatric: She has a normal mood and affect.  Nursing note and vitals reviewed.    ED Treatments / Results  Labs (all labs ordered are  listed, but only abnormal results are displayed) Labs Reviewed  WET PREP, GENITAL - Abnormal; Notable for the following:       Result Value   Clue Cells Wet Prep HPF POC PRESENT (*)    WBC, Wet Prep HPF POC MODERATE (*)    All other components within normal limits  COMPREHENSIVE METABOLIC PANEL - Abnormal; Notable for the following:    Potassium 3.4 (*)    Glucose, Bld 144 (*)    Total Protein 6.0 (*)    ALT 9 (*)    All other components within normal limits  URINALYSIS, ROUTINE W REFLEX MICROSCOPIC - Abnormal; Notable for the following:    Hgb urine dipstick MODERATE (*)    Bacteria, UA RARE (*)    Squamous Epithelial / LPF 0-5 (*)    All other components within normal limits  LIPASE, BLOOD  CBC  GC/CHLAMYDIA PROBE AMP ([place]) NOT AT [facility]    Radiology US Transvaginal Non-ob  Result Date: 01/01/2017 CLINICAL DATA:  Pelvic pain for 2 days. Patient reports being premenopausal. EXAM: TRANSABDOMINAL AND TRANSVAGINAL ULTRASOUND OF PELVIS DOPPLER ULTRASOUND OF OVARIES TECHNIQUE: Both transabdominal and transvaginal ultrasound examinations of the pelvis were performed. Transabdominal technique was performed for global imaging of the pelvis including uterus, ovaries, adnexal regions, and pelvic cul-de-sac. It was necessary to proceed with endovaginal exam following the transabdominal exam to visualize the ovaries. Color and duplex Doppler ultrasound was utilized to evaluate blood flow to the ovaries. COMPARISON:  None. FINDINGS: Uterus Measurements: 8.4 x 4.8 x 5.3 cm. No fibroids or other mass visualized. Endometrium Thickness:  15.8 mm.  No focal abnormality visualized. Right ovary Measurements: 2.2 x 1.0 x 1.2 cm. Normal appearance. Normal blood flow. No adnexal mass. Left ovary Measurements: 3.5 x 2.2 x 2.4 cm. Normal appearance. Normal blood flow. No adnexal mass. Pulsed Doppler evaluation of both ovaries demonstrates normal low-resistance arterial and venous waveforms. Other findings No  abnormal free fluid. IMPRESSION: 1. Normal appearance of the ovaries with blood flow. No evidence of torsion. 2. Endometrial thickness of 16 mm is upper limits of normal for a premenopausal patient. If there is a history of abnormal uterine bleeding and bleeding remains unresponsive to hormonal or medical therapy, focal lesion work-up with sonohysterogram should be considered. Endometrial biopsy should also be considered in pre-menopausal patients at high risk for endometrial carcinoma. (Ref: Radiological Reasoning: Algorithmic Workup of Abnormal Vaginal Bleeding with Endovaginal Sonography and Sonohysterography. AJR 2008; 621:H08-65). Electronically Signed   By: Rubye Oaks M.D.   On: 01/01/2017 23:07   US Pelvis Complete  Result Date: 01/01/2017 CLINICAL DATA:  Pelvic pain for 2 days. Patient reports being premenopausal. EXAM: TRANSABDOMINAL AND TRANSVAGINAL ULTRASOUND OF PELVIS DOPPLER ULTRASOUND OF OVARIES TECHNIQUE: Both transabdominal and transvaginal ultrasound examinations of the pelvis were performed. Transabdominal technique was performed for global imaging of the pelvis including uterus, ovaries, adnexal regions, and pelvic cul-de-sac. It was necessary to proceed with endovaginal  exam following the transabdominal exam to visualize the ovaries. Color and duplex Doppler ultrasound was utilized to evaluate blood flow to the ovaries. COMPARISON:  None. FINDINGS: Uterus Measurements: 8.4 x 4.8 x 5.3 cm. No fibroids or other mass visualized. Endometrium Thickness:  15.8 mm.  No focal abnormality visualized. Right ovary Measurements: 2.2 x 1.0 x 1.2 cm. Normal appearance. Normal blood flow. No adnexal mass. Left ovary Measurements: 3.5 x 2.2 x 2.4 cm. Normal appearance. Normal blood flow. No adnexal mass. Pulsed Doppler evaluation of both ovaries demonstrates normal low-resistance arterial and venous waveforms. Other findings No abnormal free fluid. IMPRESSION: 1. Normal appearance of the ovaries with  blood flow. No evidence of torsion. 2. Endometrial thickness of 16 mm is upper limits of normal for a premenopausal patient. If there is a history of abnormal uterine bleeding and bleeding remains unresponsive to hormonal or medical therapy, focal lesion work-up with sonohysterogram should be considered. Endometrial biopsy should also be considered in pre-menopausal patients at high risk for endometrial carcinoma. (Ref: Radiological Reasoning: Algorithmic Workup of Abnormal Vaginal Bleeding with Endovaginal Sonography and Sonohysterography. AJR 2008; 914:N82-95). Electronically Signed   By: Rubye Oaks M.D.   On: 01/01/2017 23:07   [place] Art/ven Flow Abd Pelv Doppler  Result Date: 01/01/2017 CLINICAL DATA:  Pelvic pain for 2 days. Patient reports being premenopausal. EXAM: TRANSABDOMINAL AND TRANSVAGINAL ULTRASOUND OF PELVIS DOPPLER ULTRASOUND OF OVARIES TECHNIQUE: Both transabdominal and transvaginal ultrasound examinations of the pelvis were performed. Transabdominal technique was performed for global imaging of the pelvis including uterus, ovaries, adnexal regions, and pelvic cul-de-sac. It was necessary to proceed with endovaginal exam following the transabdominal exam to visualize the ovaries. Color and duplex Doppler ultrasound was utilized to evaluate blood flow to the ovaries. COMPARISON:  None. FINDINGS: Uterus Measurements: 8.4 x 4.8 x 5.3 cm. No fibroids or other mass visualized. Endometrium Thickness:  15.8 mm.  No focal abnormality visualized. Right ovary Measurements: 2.2 x 1.0 x 1.2 cm. Normal appearance. Normal blood flow. No adnexal mass. Left ovary Measurements: 3.5 x 2.2 x 2.4 cm. Normal appearance. Normal blood flow. No adnexal mass. Pulsed Doppler evaluation of both ovaries demonstrates normal low-resistance arterial and venous waveforms. Other findings No abnormal free fluid. IMPRESSION: 1. Normal appearance of the ovaries with blood flow. No evidence of torsion. 2. Endometrial thickness  of 16 mm is upper limits of normal for a premenopausal patient. If there is a history of abnormal uterine bleeding and bleeding remains unresponsive to hormonal or medical therapy, focal lesion work-up with sonohysterogram should be considered. Endometrial biopsy should also be considered in pre-menopausal patients at high risk for endometrial carcinoma. (Ref: Radiological Reasoning: Algorithmic Workup of Abnormal Vaginal Bleeding with Endovaginal Sonography and Sonohysterography. AJR 2008; 621:H08-65). Electronically Signed   By: Rubye Oaks M.D.   On: 01/01/2017 23:07    Procedures Procedures (including critical care time)  Medications Ordered in ED Medications - No data to display   Initial Impression / Assessment and Plan / ED Course  I have reviewed the triage vital signs and the nursing notes.  Pertinent labs & imaging results that were available during my care of the patient were reviewed by me and considered in my medical decision making (see chart for details).     Patient seen and examined. Workup to this point negative. Patient with blood in the urine. Symptoms are not suggestive of renal colic. She denies current vaginal bleeding. Will need pelvic exam.  Vital signs reviewed and are as follows:  BP 120/60 (BP Location: Left Arm)   Pulse 71   Temp 98.6 F (37 C) (Oral)   Resp 17   Wt 57.8 kg   LMP 12/08/2016 (Within Days)   SpO2 97%   BMI 21.22 kg/m   Pelvic exam performed with RN chaperone. Ultrasound ordered.  12:15 AM patient updated on results. Regarding upper limit of normal uterine lining thickness, encourage patient follow-up with her GYN. Referral given.  Will discharge with ibuprofen to use for pain.  The patient was urged to return to the Emergency Department immediately with worsening of current symptoms, worsening abdominal pain, persistent vomiting, blood noted in stools, fever, or any other concerns. The patient verbalized understanding.    Final  Clinical Impressions(s) / ED Diagnoses   Final diagnoses:  Abdominal pain  Pelvic pain   Patient with lower abdominal/pelvic pain. Labs are reassuring. Pelvic exam does not identify source of bleeding or significant pain. Ultrasound shows upper limit of normal endometrium otherwise normal. Patient has not had any vaginal bleeding reported or on exam. Her lower abdominal pain is mild.   Vitals are stable, no fever. No signs of dehydration, patient is tolerating PO's. Lungs are clear and no signs suggestive of PNA. Low concern for appendicitis, cholecystitis, pancreatitis, ruptured viscus, UTI, kidney stone, aortic dissection, aortic aneurysm or other emergent abdominal etiology. Supportive therapy indicated with return if symptoms worsen.    New Prescriptions New Prescriptions   IBUPROFEN (ADVIL,MOTRIN) 600 MG TABLET    Take 1 tablet (600 mg total) by mouth every 6 (six) hours as needed.     Renne Crigler, PA-C 01/02/17 0018    Rolan Bucco, MD 01/02/17 [phone]

## 2017-01-01 NOTE — ED Notes (Signed)
Patient transported to Ultrasound 

## 2017-01-01 NOTE — ED Triage Notes (Signed)
Pt states she has had RLQ pain around her ovaries that began last night. She also reports a small amount of pain when she goes to urinate. Pt denies n/v/d.

## 2017-01-02 NOTE — ED Notes (Signed)
PT states understanding of care given, follow up care, and medication prescribed. PT ambulated from ED to car with a steady gait. 

## 2017-01-04 LAB — GC/CHLAMYDIA PROBE AMP (~~LOC~~) NOT AT ARMC
CHLAMYDIA, DNA PROBE: NEGATIVE
NEISSERIA GONORRHEA: NEGATIVE

## 2017-06-15 ENCOUNTER — Other Ambulatory Visit: Payer: Self-pay

## 2017-06-15 ENCOUNTER — Encounter (HOSPITAL_COMMUNITY): Payer: Self-pay | Admitting: Emergency Medicine

## 2017-06-15 ENCOUNTER — Emergency Department (HOSPITAL_COMMUNITY)
Admission: EM | Admit: 2017-06-15 | Discharge: 2017-06-15 | Disposition: A | Discharge status: Home / Self Care | Payer: Self-pay | Attending: Emergency Medicine | Admitting: Emergency Medicine

## 2017-06-15 DIAGNOSIS — F17210 Nicotine dependence, cigarettes, uncomplicated: Secondary | ICD-10-CM | POA: Insufficient documentation

## 2017-06-15 DIAGNOSIS — J069 Acute upper respiratory infection, unspecified: Secondary | ICD-10-CM | POA: Insufficient documentation

## 2017-06-15 LAB — BASIC METABOLIC PANEL
Anion gap: 9 (ref 5–15)
BUN: 14 mg/dL (ref 6–20)
CALCIUM: 8.7 mg/dL — AB (ref 8.9–10.3)
CO2: 20 mmol/L — AB (ref 22–32)
CREATININE: 0.57 mg/dL (ref 0.44–1.00)
Chloride: 106 mmol/L (ref 101–111)
GFR calc Af Amer: >60 mL/min (ref >60)
GFR calc non Af Amer: >60 mL/min (ref >60)
GLUCOSE: 88 mg/dL (ref 65–99)
Potassium: 3.7 mmol/L (ref 3.5–5.1)
Sodium: 135 mmol/L (ref 135–145)

## 2017-06-15 LAB — CBC
HCT: 39.6 % (ref 36.0–46.0)
Hemoglobin: 13.7 g/dL (ref 12.0–15.0)
MCH: 31.4 pg (ref 26.0–34.0)
MCHC: 34.6 g/dL (ref 30.0–36.0)
MCV: 90.8 fL (ref 78.0–100.0)
PLATELETS: 306 10*3/uL (ref 150–400)
RBC: 4.36 MIL/uL (ref 3.87–5.11)
RDW: 13.7 % (ref 11.5–15.5)
WBC: 9.3 10*3/uL (ref 4.0–10.5)

## 2017-06-15 MED ORDER — ACETAMINOPHEN 325 MG PO TABS
650.0000 mg | ORAL_TABLET | Freq: Once | ORAL | Status: AC
  Administered 2017-06-15: 650 mg via ORAL
  Filled 2017-06-15: qty 2

## 2017-06-15 NOTE — Discharge Instructions (Signed)
Please read instructions below. You can take 500mg  of tylenol and 400mg  of advil every 6 hours as needed for sore throat or body aches. Drink plenty of water. You can take mucinex for nasal congestion. Follow up with your primary care provider as needed. Return to the ER for difficulty swallowing liquids, difficulty breathing, or new or worsening symptoms.  Por favor, lea las instrucciones a continuacin. Puede tomar 500 mg de tylenol y 400 mg de advil cada 6 horas segn sea necesario para el dolor de garganta o dolor de cuerpo. Beber abundante agua. Puede tomar mucinex para la congestin nasal. Haga un seguimiento con su proveedor de atencin primaria segn sea necesario. Regrese a la sala de emergencias si tiene dificultad para tragar lquidos, dificultad para respirar o sntomas nuevos o que empeoran.

## 2017-06-15 NOTE — ED Triage Notes (Signed)
Pt to ER for evaluation of sinus congestion and sinus pain x5-6 days. States nauseated. States dizziness as well.

## 2017-06-15 NOTE — ED Provider Notes (Signed)
[facility]ERGENC[facility] Provider Note   CSN: [id number] Arrival date & time: 06/15/17  1625     History   Chief Complaint Chief Complaint  Patient presents with  . Nasal Congestion    HPI Amber Crawford is a 51 y.o. female with past medical history of gastritis, presenting to the ED with 6 days of nasal congestion, sore throat, and myalgias. Patient states she's been taking Tylenol and Aleve with mild relief of symptoms. Reports associated sinus headache, and episode of nausea last night. Denies cough, fever, chills, wrist pain, shortness of breath, difficulty swallowing or breathing, abdominal pain, ear pain, or any other complaints.  The history is provided by the patient. The history is limited by a language barrier. A language interpreter was used.    Past Medical History:  Diagnosis Date  . Gastritis     There are no active problems to display for this patient.   Past Surgical History:  Procedure Laterality Date  . CESAREAN SECTION      OB History    Gravida Para Term Preterm AB Living   5       1 4    SAB TAB Ectopic Multiple Live Births     1             Home Medications    Prior to Admission medications   Medication Sig Start Date End Date Taking? Authorizing Provider  ibuprofen (ADVIL,MOTRIN) 600 MG tablet Take 1 tablet (600 mg total) by mouth every 6 (six) hours as needed. Patient not taking: Reported on 06/15/2017 01/01/17   Renne CriglerGeiple, Joshua, PA-C    Family History History reviewed. No pertinent family history.  Social History Social History  Substance Use Topics  . Smoking status: Current Every Day Smoker    Packs/day: 0.50    Types: Cigarettes  . Smokeless tobacco: Never Used  . Alcohol use No     Allergies   Aspirin   Review of Systems Review of Systems  Constitutional: Negative for chills and fever.  HENT: Positive for congestion, sinus pressure and sore throat. Negative for ear pain, trouble  swallowing and voice change.   Eyes: Negative for visual disturbance.  Respiratory: Negative for cough and shortness of breath.   Cardiovascular: Negative for chest pain.  Gastrointestinal: Positive for nausea. Negative for abdominal pain.  Musculoskeletal: Negative for neck pain and neck stiffness.  Neurological: Positive for headaches.  All other systems reviewed and are negative.    Physical Exam Updated Vital Signs BP (!) 123/58   Pulse 74   Temp 98.1 F (36.7 C) (Oral)   Resp 16   Ht 5\' 1"  (1.549 m)   Wt 59.4 kg (131 lb)   SpO2 99%   BMI 24.75 kg/m   Physical Exam  Constitutional: She appears well-developed and well-nourished. No distress.  Well-appearing, tolerating secretions.  HENT:  Head: Normocephalic and atraumatic.  Right Ear: Hearing, tympanic membrane, external ear and ear canal normal.  Left Ear: Hearing, tympanic membrane, external ear and ear canal normal.  Mouth/Throat: Uvula is midline. No trismus in the jaw. No uvula swelling. Posterior oropharyngeal erythema present. No oropharyngeal exudate or posterior oropharyngeal edema.  Some nasal congestion noted  Eyes: Conjunctivae are normal.  Neck: Normal range of motion. Neck supple. No tracheal deviation present.  Cardiovascular: Normal rate, regular rhythm, normal heart sounds and intact distal pulses.   Pulmonary/Chest: Effort normal and breath sounds normal. No stridor. No respiratory distress. She has no wheezes.  She has no rales.  Abdominal: Soft. Bowel sounds are normal. She exhibits no distension and no mass. There is no tenderness. There is no rebound.  Lymphadenopathy:    She has no cervical adenopathy.  Neurological: She is alert.  Skin: Skin is warm.  Psychiatric: She has a normal mood and affect. Her behavior is normal.  Nursing note and vitals reviewed.    ED Treatments / Results  Labs (all labs ordered are listed, but only abnormal results are displayed) Labs Reviewed  BASIC METABOLIC  PANEL - Abnormal; Notable for the following:       Result Value   CO2 20 (*)    Calcium 8.7 (*)    All other components within normal limits  CBC    EKG  EKG Interpretation None       Radiology No results found.  Procedures Procedures (including critical care time)  Medications Ordered in ED Medications  acetaminophen (TYLENOL) tablet 650 mg (not administered)     Initial Impression / Assessment and Plan / ED Course  I have reviewed the triage vital signs and the nursing notes.  Pertinent labs & imaging results that were available during my care of the patient were reviewed by me and considered in my medical decision making (see chart for details).     Patients symptoms are consistent with URI, likely viral etiology. Discussed that antibiotics are not indicated for viral infections. Pt is afebrile, tolerating secretions. Pt will be discharged with symptomatic treatment.  Verbalizes understanding and is agreeable with plan. Pt is hemodynamically stable & in NAD prior to dc.  Discussed results, findings, treatment and follow up. Patient advised of return precautions. Patient verbalized understanding and agreed with plan.  Final Clinical Impressions(s) / ED Diagnoses   Final diagnoses:  Viral URI    New Prescriptions New Prescriptions   No medications on file     Russo, [place] N, PA-C 06/15/17 2309    Mancel Bale, MD 06/16/17 1216

## 2017-07-16 ENCOUNTER — Other Ambulatory Visit: Payer: Self-pay

## 2017-07-16 ENCOUNTER — Encounter (HOSPITAL_COMMUNITY): Payer: Self-pay | Admitting: Emergency Medicine

## 2017-07-16 ENCOUNTER — Emergency Department (HOSPITAL_COMMUNITY)
Admission: EM | Admit: 2017-07-16 | Discharge: 2017-07-16 | Disposition: A | Discharge status: Home / Self Care | Payer: Medicaid Other | Attending: Emergency Medicine | Admitting: Emergency Medicine

## 2017-07-16 DIAGNOSIS — M549 Dorsalgia, unspecified: Secondary | ICD-10-CM

## 2017-07-16 DIAGNOSIS — Z9049 Acquired absence of other specified parts of digestive tract: Secondary | ICD-10-CM | POA: Insufficient documentation

## 2017-07-16 DIAGNOSIS — F17210 Nicotine dependence, cigarettes, uncomplicated: Secondary | ICD-10-CM | POA: Insufficient documentation

## 2017-07-16 MED ORDER — PREDNISONE 20 MG PO TABS: 20.0000 mg | ORAL_TABLET | Freq: Two times a day (BID) | ORAL | 0 refills | Status: DC

## 2017-07-16 NOTE — ED Provider Notes (Signed)
[facility] Provider Note   CSN: [id number] Arrival date & time: 07/16/17  [id number]     History   Chief Complaint Chief Complaint  Patient presents with  . Back Pain  . Spasms  . Dizziness    HPI Rica Mastorma Fernandez de Haze JustinLujan is a 51 y.o. female.  Here for evaluation of back pain.  Interviewed through [employer] interpreter system.  Patient complains of upper back pain radiating to the neck and head present for several days, without trauma.  No gait disorder, arm problems or fever.  She also has ongoing intermittent vision changes, for many years.  She denies chest pain shortness of breath focal weakness or paresthesia.  There are no other known modifying factors.  HPI  Past Medical History:  Diagnosis Date  . Gastritis     There are no active problems to display for this patient.   Past Surgical History:  Procedure Laterality Date  . CESAREAN SECTION    . CHOLECYSTECTOMY      OB History    Gravida Para Term Preterm AB Living   5       1 4    SAB TAB Ectopic Multiple Live Births     1             Home Medications    Prior to Admission medications   Medication Sig Start Date End Date Taking? Authorizing Provider  ibuprofen (ADVIL,MOTRIN) 600 MG tablet Take 1 tablet (600 mg total) by mouth every 6 (six) hours as needed. Patient not taking: Reported on 06/15/2017 01/01/17   Renne CriglerGeiple, Joshua, PA-C  predniSONE (DELTASONE) 20 MG tablet Take 1 tablet (20 mg total) by mouth 2 (two) times daily. 07/16/17   Mancel BaleWentz, Mikea Quadros, MD    Family History No family history on file.  Social History Social History   Tobacco Use  . Smoking status: Current Every Day Smoker    Packs/day: 0.50    Types: Cigarettes  . Smokeless tobacco: Never Used  Substance Use Topics  . Alcohol use: No  . Drug use: No     Allergies   Aspirin   Review of Systems Review of Systems  All other systems reviewed and are negative.    Physical Exam Updated  Vital Signs BP 109/60   Pulse 74   Temp 98.6 F (37 C) (Oral)   Resp 18   Ht 5' (1.524 m)   Wt 58.1 kg (128 lb)   LMP 06/08/2017   SpO2 98%   BMI 25.00 kg/m   Physical Exam  Constitutional: She is oriented to person, place, and time. She appears well-developed and well-nourished. No distress.  HENT:  Head: Normocephalic and atraumatic.  Eyes: Conjunctivae and EOM are normal. Pupils are equal, round, and reactive to light.  Neck: Normal range of motion and phonation normal. Neck supple.  Cardiovascular: Normal rate and regular rhythm.  Pulmonary/Chest: Effort normal and breath sounds normal. She exhibits no tenderness.  Abdominal: Soft. She exhibits no distension. There is no tenderness. There is no guarding.  Musculoskeletal: Normal range of motion. She exhibits no deformity.  No tenderness of the cervical, thoracic or lumbar spines  Neurological: She is alert and oriented to person, place, and time. She exhibits normal muscle tone.  No dysarthria, aphasia or nystagmus.  Skin: Skin is warm and dry.  Psychiatric: She has a normal mood and affect. Her behavior is normal. Judgment and thought content normal.  Nursing note and vitals reviewed.    ED  Treatments / Results  Labs (all labs ordered are listed, but only abnormal results are displayed) Labs Reviewed - No data to display  EKG  EKG Interpretation None       Radiology No results found.  Procedures Procedures (including critical care time)  Medications Ordered in ED Medications - No data to display   Initial Impression / Assessment and Plan / ED Course  I have reviewed the triage vital signs and the nursing notes.  Pertinent labs & imaging results that were available during my care of the patient were reviewed by me and considered in my medical decision making (see chart for details).      Patient Vitals for the past 24 hrs:  BP Temp Temp src Pulse Resp SpO2 Height Weight  07/16/17 1200 109/60 - - 74  18 98 % - -  07/16/17 1145 120/63 - - 74 - 98 % - -  07/16/17 1130 126/66 - - 72 - 98 % - -  07/16/17 0955 (!) 149/81 98.6 F (37 C) Oral 85 16 100 % 5' (1.524 m) 58.1 kg (128 lb)    12:31 PM Reevaluation with update and discussion. After initial assessment and treatment, an updated evaluation reveals no change in clinical status.  Findings discussed with patient and all questions were answered. Mancel BaleElliott Gurveer Colucci     Final Clinical Impressions(s) / ED Diagnoses   Final diagnoses:  Upper back pain   Nonspecific upper back pain, likely muscle strain.  Consider DJD as well.  Doubt spinal myelopathy.  Nursing Notes Reviewed/ Care Coordinated Applicable Imaging Reviewed Interpretation of Laboratory Data incorporated into ED treatment  The patient appears reasonably screened and/or stabilized for discharge and I doubt any other medical condition or other [facility] requiring further screening, evaluation, or treatment in the ED at this time prior to discharge.  Plan: Home Medications-OTC analgesia as needed; Home Treatments-heat to affected area; return here if the recommended treatment, does not improve the symptoms; Recommended follow up-PCP as needed   ED Discharge Orders        Ordered    predniSONE (DELTASONE) 20 MG tablet  2 times daily     07/16/17 1228       Mancel BaleWentz, Afsheen Antony, MD 07/16/17 1232

## 2017-07-16 NOTE — Discharge Instructions (Signed)
Use heat on the sore area 3 or 4 times a day.  Also try some gentle stretching exercises to help your pain.  Follow-up with a primary care doctor for further evaluation and treatment as needed.

## 2017-07-16 NOTE — ED Triage Notes (Addendum)
Pt c/o muscle spasm/ back pain started 5 days ago, pain with palpation over scapular area.  Pt also c/o dizziness-- with pain and turning head. States when driving it seems that the road is moving,  Using [employer] interpretor machine for assessment

## 2017-07-24 ENCOUNTER — Emergency Department (HOSPITAL_COMMUNITY)
Admission: EM | Admit: 2017-07-24 | Discharge: 2017-07-24 | Disposition: A | Discharge status: Home / Self Care | Payer: Medicaid Other | Attending: Emergency Medicine | Admitting: Emergency Medicine

## 2017-07-24 ENCOUNTER — Other Ambulatory Visit: Payer: Self-pay

## 2017-07-24 ENCOUNTER — Encounter (HOSPITAL_COMMUNITY): Payer: Self-pay

## 2017-07-24 DIAGNOSIS — H8111 Benign paroxysmal vertigo, right ear: Secondary | ICD-10-CM

## 2017-07-24 DIAGNOSIS — R42 Dizziness and giddiness: Secondary | ICD-10-CM

## 2017-07-24 DIAGNOSIS — F17210 Nicotine dependence, cigarettes, uncomplicated: Secondary | ICD-10-CM | POA: Insufficient documentation

## 2017-07-24 LAB — COMPREHENSIVE METABOLIC PANEL
ALBUMIN: 3.6 g/dL (ref 3.5–5.0)
ALT: 12 U/L — ABNORMAL LOW (ref 14–54)
ANION GAP: 9 (ref 5–15)
AST: 16 U/L (ref 15–41)
Alkaline Phosphatase: 79 U/L (ref 38–126)
BILIRUBIN TOTAL: 0.4 mg/dL (ref 0.3–1.2)
BUN: 5 mg/dL — ABNORMAL LOW (ref 6–20)
CHLORIDE: 104 mmol/L (ref 101–111)
CO2: 24 mmol/L (ref 22–32)
Calcium: 8.4 mg/dL — ABNORMAL LOW (ref 8.9–10.3)
Creatinine, Ser: 0.63 mg/dL (ref 0.44–1.00)
GFR calc Af Amer: >60 mL/min (ref >60)
GFR calc non Af Amer: >60 mL/min (ref >60)
GLUCOSE: 90 mg/dL (ref 65–99)
POTASSIUM: 3.4 mmol/L — AB (ref 3.5–5.1)
SODIUM: 137 mmol/L (ref 135–145)
TOTAL PROTEIN: 6.5 g/dL (ref 6.5–8.1)

## 2017-07-24 LAB — CBC
HEMATOCRIT: 41.8 % (ref 36.0–46.0)
HEMOGLOBIN: 14.4 g/dL (ref 12.0–15.0)
MCH: 31.6 pg (ref 26.0–34.0)
MCHC: 34.4 g/dL (ref 30.0–36.0)
MCV: 91.7 fL (ref 78.0–100.0)
Platelets: 271 10*3/uL (ref 150–400)
RBC: 4.56 MIL/uL (ref 3.87–5.11)
RDW: 13.5 % (ref 11.5–15.5)
WBC: 8.1 10*3/uL (ref 4.0–10.5)

## 2017-07-24 LAB — I-STAT BETA HCG BLOOD, ED (MC, WL, AP ONLY): I-stat hCG, quantitative: <5 m[IU]/mL (ref <5)

## 2017-07-24 LAB — LIPASE, BLOOD: LIPASE: 34 U/L (ref 11–51)

## 2017-07-24 MED ORDER — MECLIZINE HCL 25 MG PO TABS
25.0000 mg | ORAL_TABLET | Freq: Once | ORAL | Status: AC
  Administered 2017-07-24: 25 mg via ORAL
  Filled 2017-07-24: qty 1

## 2017-07-24 MED ORDER — MECLIZINE HCL 25 MG PO TABS: 25.0000 mg | ORAL_TABLET | Freq: Three times a day (TID) | ORAL | 0 refills | Status: DC | PRN

## 2017-07-24 NOTE — ED Triage Notes (Signed)
Triage completed using [employer] interpreter, ID: [id number]  Pt states for 4 days she has had nausea, back pain, and dizziness. Pt is alert and oriented. Ambulatory. She also reports a pain in her back.

## 2017-07-24 NOTE — ED Notes (Signed)
Got patient hooked up to the monitor patient is resting with call bell in rrsach

## 2017-07-24 NOTE — ED Provider Notes (Signed)
[facility] EMERGENCY DEPARTMENT Provider Note  CSN: [id number] Arrival date & time: 07/24/17 5621  Chief Complaint(s) Nausea and Numbness  HPI Amber Crawford is a 51 y.o. female   The history is provided by the patient.  Dizziness  Quality:  Vertigo Severity:  Moderate Onset quality:  Gradual Duration:  8 days Timing:  Intermittent Progression:  Waxing and waning Chronicity:  Recurrent Context: eye movement, head movement and standing up   Relieved by:  Being still Worsened by:  Eye movement, movement and standing up Associated symptoms: nausea   Associated symptoms: no blood in stool, no chest pain, no diarrhea, no headaches, no hearing loss, no palpitations, no shortness of breath, no syncope, no tinnitus, no vision changes, no vomiting and no weakness    Patient also is complaining of bilateral upper and lower extremity numbness and tingling that occurs when she lies down.  Resolves with standing.  No focal deficits.  No associated pain.  No recent trauma.  No associated headache.  Past Medical History Past Medical History:  Diagnosis Date  . Gastritis    There are no active problems to display for this patient.  Home Medication(s) Prior to Admission medications   Medication Sig Start Date End Date Taking? Authorizing Provider  ibuprofen (ADVIL,MOTRIN) 600 MG tablet Take 1 tablet (600 mg total) by mouth every 6 (six) hours as needed. Patient not taking: Reported on 06/15/2017 01/01/17   Renne Crigler, PA-C  meclizine (ANTIVERT) 25 MG tablet Take 1 tablet (25 mg total) by mouth 3 (three) times daily as needed for dizziness. 07/24/17   Nira Conn, MD  predniSONE (DELTASONE) 20 MG tablet Take 1 tablet (20 mg total) by mouth 2 (two) times daily. 07/16/17   Mancel Bale, MD                                                                                                                                    Past Surgical History Past  Surgical History:  Procedure Laterality Date  . CESAREAN SECTION    . CHOLECYSTECTOMY     Family History History reviewed. No pertinent family history.  Social History Social History   Tobacco Use  . Smoking status: Current Every Day Smoker    Packs/day: 0.50    Types: Cigarettes  . Smokeless tobacco: Never Used  Substance Use Topics  . Alcohol use: No  . Drug use: No   Allergies Aspirin  Review of Systems Review of Systems  HENT: Negative for hearing loss and tinnitus.   Respiratory: Negative for shortness of breath.   Cardiovascular: Negative for chest pain, palpitations and syncope.  Gastrointestinal: Positive for nausea. Negative for blood in stool, diarrhea and vomiting.  Neurological: Positive for dizziness. Negative for weakness and headaches.   All other systems are reviewed and are negative for acute change except as noted in the HPI  Physical Exam Vital Signs  I have reviewed the triage vital signs BP (!) 156/91 (BP Location: Right Arm)   Pulse 91   Temp 98.4 F (36.9 C) (Oral)   Resp 19   LMP 07/21/2017   SpO2 96%   Physical Exam  Constitutional: She is oriented to person, place, and time. She appears well-developed and well-nourished. No distress.  HENT:  Head: Normocephalic and atraumatic.  Right Ear: External ear normal.  Left Ear: External ear normal.  Nose: Nose normal.  Eyes: Conjunctivae and EOM are normal. No scleral icterus.  Neck: Normal range of motion and phonation normal.  Cardiovascular: Normal rate and regular rhythm.  Pulmonary/Chest: Effort normal. No stridor. No respiratory distress.  Abdominal: She exhibits no distension.  Musculoskeletal: Normal range of motion. She exhibits no edema.  Neurological: She is alert and oriented to person, place, and time.  Mental Status:  Alert and oriented to person, place, and time.  Attention and concentration normal.  Speech clear.  Recent memory is intact  Cranial Nerves:  II Visual  Fields: Intact to confrontation. Visual fields intact. III, IV, VI: Pupils equal and reactive to light and near. Full eye movement with left lateral nystagmus  V Facial Sensation: Normal. No weakness of masticatory muscles  VII: No facial weakness or asymmetry  VIII Auditory Acuity: Grossly normal  IX/X: The uvula is midline; the palate elevates symmetrically  XI: Normal sternocleidomastoid and trapezius strength  XII: The tongue is midline. No atrophy or fasciculations.   Motor System: Muscle Strength: 5/5 and symmetric in the upper and lower extremities. No pronation or drift.  Muscle Tone: Tone and muscle bulk are normal in the upper and lower extremities.   Reflexes: DTRs: 1+ and symmetrical in all four extremities. No Clonus Coordination: Intact finger-to-nose, heel-to-shin. No tremor.  Sensation: Intact to light touch, and pinprick.  Gait: Routine  gait normal.  HINTS: Nystagmus: left lateral Head impulse: +saccade with right HI Skew: normal    Skin: She is not diaphoretic.  Psychiatric: She has a normal mood and affect. Her behavior is normal.  Vitals reviewed.   ED Results and Treatments Labs (all labs ordered are listed, but only abnormal results are displayed) Labs Reviewed  COMPREHENSIVE METABOLIC PANEL - Abnormal; Notable for the following components:      Result Value   Potassium 3.4 (*)    BUN 5 (*)    Calcium 8.4 (*)    ALT 12 (*)    All other components within normal limits  LIPASE, BLOOD  CBC  URINALYSIS, ROUTINE W REFLEX MICROSCOPIC  I-STAT BETA HCG BLOOD, ED ([facility], [facility], [facility] ONLY)                                                                                                                         EKG  EKG Interpretation  Date/Time:    Ventricular Rate:    PR Interval:    QRS Duration:   QT Interval:    QTC Calculation:   R  Axis:     Text Interpretation:        Radiology No results found. Pertinent labs & imaging results that were available  during my care of the patient were reviewed by me and considered in my medical decision making (see chart for details).  Medications Ordered in ED Medications  meclizine (ANTIVERT) tablet 25 mg (25 mg Oral Given 07/24/17 1025)                                                                                                                                    Procedures Procedures  (including critical care time)  Medical Decision Making / ED Course I have reviewed the nursing notes for this encounter and the patient's prior records (if available in EHR or on provided paperwork).    1. Dizziness Positional.  Worse with head movement, eye movement and positional changes.  Nonfocal neuro exam.  Hints exam reassuring for peripheral etiology.  Low suspicion for central process.  Patient provided with meclizine for symptomatic relief.   2.  Hand and feet numbness. Nonspecific likely secondary to decreased circulation due to position.  Pulses intact.  The patient appears reasonably screened and/or stabilized for discharge and I doubt any other medical condition or other [facility] requiring further screening, evaluation, or treatment in the ED at this time prior to discharge.  The patient is safe for discharge with strict return precautions.  Final Clinical Impression(s) / ED Diagnoses Final diagnoses:  Benign paroxysmal positional vertigo of right ear  Vertigo   Disposition: Discharge  Condition: Good  I have discussed the results, Dx and Tx plan with the patient who expressed understanding and agree(s) with the plan. Discharge instructions discussed at great length. The patient was given strict return precautions who verbalized understanding of the instructions. No further questions at time of discharge.    ED Discharge Orders        Ordered    meclizine (ANTIVERT) 25 MG tablet  3 times daily PRN     07/24/17 1031      This chart was dictated using voice recognition software.  Despite best  efforts to proofread,  errors can occur which can change the documentation meaning.   Nira Connardama, Derrico Zhong Eduardo, MD 07/24/17 1031

## 2017-09-30 ENCOUNTER — Emergency Department (HOSPITAL_COMMUNITY)
Admission: EM | Admit: 2017-09-30 | Discharge: 2017-09-30 | Disposition: A | Discharge status: Home / Self Care | Payer: Medicaid Other | Attending: Emergency Medicine | Admitting: Emergency Medicine

## 2017-09-30 ENCOUNTER — Encounter (HOSPITAL_COMMUNITY): Payer: Self-pay | Admitting: *Deleted

## 2017-09-30 DIAGNOSIS — S46819A Strain of other muscles, fascia and tendons at shoulder and upper arm level, unspecified arm, initial encounter: Secondary | ICD-10-CM

## 2017-09-30 DIAGNOSIS — F17210 Nicotine dependence, cigarettes, uncomplicated: Secondary | ICD-10-CM | POA: Insufficient documentation

## 2017-09-30 DIAGNOSIS — Y929 Unspecified place or not applicable: Secondary | ICD-10-CM | POA: Insufficient documentation

## 2017-09-30 DIAGNOSIS — Y939 Activity, unspecified: Secondary | ICD-10-CM | POA: Insufficient documentation

## 2017-09-30 DIAGNOSIS — R0981 Nasal congestion: Secondary | ICD-10-CM

## 2017-09-30 DIAGNOSIS — M542 Cervicalgia: Secondary | ICD-10-CM

## 2017-09-30 DIAGNOSIS — X58XXXA Exposure to other specified factors, initial encounter: Secondary | ICD-10-CM | POA: Insufficient documentation

## 2017-09-30 DIAGNOSIS — Y999 Unspecified external cause status: Secondary | ICD-10-CM | POA: Insufficient documentation

## 2017-09-30 DIAGNOSIS — S29012A Strain of muscle and tendon of back wall of thorax, initial encounter: Secondary | ICD-10-CM | POA: Insufficient documentation

## 2017-09-30 MED ORDER — CYCLOBENZAPRINE HCL 5 MG PO TABS: 10.0000 mg | ORAL_TABLET | Freq: Two times a day (BID) | ORAL | 0 refills | Status: AC | PRN

## 2017-09-30 MED ORDER — FLUTICASONE PROPIONATE 50 MCG/ACT NA SUSP: 1.0000 | Freq: Every day | NASAL | 2 refills | Status: DC

## 2017-09-30 MED ORDER — IBUPROFEN 600 MG PO TABS: 600.0000 mg | ORAL_TABLET | Freq: Four times a day (QID) | ORAL | 0 refills | Status: DC | PRN

## 2017-09-30 NOTE — ED Provider Notes (Signed)
[facility] EMERGENCY DEPARTMENT Provider Note   CSN: [id number] Arrival date & time: 09/30/17  7829     History   Chief Complaint Chief Complaint  Patient presents with  . Neck Pain  . Sore Throat  . Nasal Congestion    HPI Amber Crawford is a 52 y.o. female.  HPI   52 year old female with gastritis presenting for bilateral paraspinal neck pain, bilateral shoulder pain, headaches, and congestion and facial pain.  Patient reports that her pain has occurred for approximately 6 days.  Patient denies any associated injury or heavy lifting experience preceding her symptoms.  Patient denies fever, chills, numbness, weakness, or paresthesias in extremities.  Patient also reports she has some facial pain with associated congestion and rhinorrhea.  Patient reports that the tension she feels in her neck is causing some "swelling".  Patient reports that she is asymptomatic of headache at present, but when it comes it is a stabbing and pulsating pain all over her head and extends to bilateral ears.  Patient reports that the symptoms commonly occur before her period, however she finished her period last week, and is not due to get another one for a few weeks.  Past Medical History:  Diagnosis Date  . Gastritis     There are no active problems to display for this patient.   Past Surgical History:  Procedure Laterality Date  . CESAREAN SECTION    . CHOLECYSTECTOMY      OB History    Gravida Para Term Preterm AB Living   5       1 4    SAB TAB Ectopic Multiple Live Births     1             Home Medications    Prior to Admission medications   Medication Sig Start Date End Date Taking? Authorizing Provider  ibuprofen (ADVIL,MOTRIN) 600 MG tablet Take 1 tablet (600 mg total) by mouth every 6 (six) hours as needed. Patient not taking: Reported on 06/15/2017 01/01/17   Renne Crigler, PA-C  meclizine (ANTIVERT) 25 MG tablet Take 1 tablet (25 mg total) by  mouth 3 (three) times daily as needed for dizziness. 07/24/17   Nira Conn, MD  predniSONE (DELTASONE) 20 MG tablet Take 1 tablet (20 mg total) by mouth 2 (two) times daily. 07/16/17   Mancel Bale, MD    Family History No family history on file.  Social History Social History   Tobacco Use  . Smoking status: Current Every Day Smoker    Packs/day: 0.50    Types: Cigarettes  . Smokeless tobacco: Never Used  Substance Use Topics  . Alcohol use: No  . Drug use: No     Allergies   Aspirin   Review of Systems Review of Systems  Constitutional: Negative for chills and fever.  HENT: Positive for congestion, rhinorrhea, sinus pressure and sinus pain. Negative for ear pain and facial swelling.   Respiratory: Negative for cough, chest tightness and shortness of breath.   Cardiovascular: Negative for chest pain.  Musculoskeletal: Positive for arthralgias and neck pain.  Neurological: Positive for headaches. Negative for dizziness, weakness, light-headedness and numbness.     Physical Exam Updated Vital Signs BP (!) 154/75 (BP Location: Right Arm)   Pulse 83   Temp 99.1 F (37.3 C) (Oral)   Resp 18   Ht 5' (1.524 m)   Wt 59 kg (130 lb)   SpO2 100%   BMI 25.39 kg/m  Physical Exam  Constitutional: She appears well-developed and well-nourished. No distress.  Sitting comfortably in bed.  HENT:  Head: Normocephalic and atraumatic.  Right Ear: Tympanic membrane normal.  Left Ear: Tympanic membrane normal.  Mouth/Throat: Uvula is midline and oropharynx is clear and moist.  Normal phonation. No muffled voice sounds. Patient swallows secretions without difficulty. Dentition normal. No lesions of tongue or buccal mucosa. Uvula midline. No asymmetric swelling of the posterior pharynx. No Erythema of posterior pharynx. No tonsillar exuduate. No lingual swelling. No induration inferior to tongue. No submandibular tenderness, swelling, or induration.  Tissues of the neck  supple. No cervical lymphadenopathy. Right TM without erythema or effusion; left TM without erythema or effusion.  Eyes: Conjunctivae are normal. Right eye exhibits no discharge. Left eye exhibits no discharge.  EOMs normal to gross examination.  Neck: Normal range of motion.  Cardiovascular: Normal rate and regular rhythm.  Intact, 2+ radial pulse bilaterally.   Pulmonary/Chest:  Normal respiratory effort. Patient converses comfortably. No audible wheeze or stridor.  Abdominal: She exhibits no distension.  Musculoskeletal: Normal range of motion.  PALPATION: No midline but paraspinal musculature tenderness of cervical and thoracic spine, particularly over trapezius bilaterally. ROM of cervical spine intact with flexion/extension/lateral flexion/lateral rotation; Patient can laterally rotate cervical spine greater than 45 degrees.  MOTOR: 5/5 strength b/l with resisted shoulder abduction/adduction, biceps flexion (C5/6), biceps extension (C6-C8), wrist flexion, wrist extension (C6-C8), and grip strength (C7-T1) 2+ DTRs in the biceps  SENSORY: Sensation is intact to light/sharp touch in:  Superficial radial nerve distribution (dorsal first web space) Median nerve distribution (tip of index finger)   Ulnar nerve distribution (tip of small finger)  Patient moves LEs symmetrically and with good coordination. Patient ambulates symmetrically with no evidence of LE weakness.  Neurological: She is alert.  Cranial nerves intact to gross observation. Patient moves extremities without difficulty.  Skin: Skin is warm and dry. She is not diaphoretic.  Psychiatric: She has a normal mood and affect. Her behavior is normal. Judgment and thought content normal.  Nursing note and vitals reviewed.    ED Treatments / Results  Labs (all labs ordered are listed, but only abnormal results are displayed) Labs Reviewed - No data to display  EKG  EKG Interpretation None       Radiology No results  found.  Procedures Procedures (including critical care time)  Medications Ordered in ED Medications - No data to display   Initial Impression / Assessment and Plan / ED Course  I have reviewed the triage vital signs and the nursing notes.  Pertinent labs & imaging results that were available during my care of the patient were reviewed by me and considered in my medical decision making (see chart for details).    Final Clinical Impressions(s) / ED Diagnoses   Final diagnoses:  Trapezius muscle strain, unspecified laterality, initial encounter  Neck pain  Nasal congestion   Patient is nontoxic-appearing, afebrile, no acute distress.  Patient's neck pain is a four-point tender pain over bilateral trapezius muscles without midline neck tenderness.  Patient does not have any concerning features for acute life threatening causes of neck pain including vertebral artery dissection, carotid disease, cervical spinal compression or myelopathy.  Patient's facial pain and rhinorrhea likely due to nasal congestion secondary to a viral sinusitis.  Will prescribe Flonase.  Additionally, patient prescribed Flexeril and encouraged to use topical heat patches for her neck pain.  Patient instructed not to drive, operate machinery, or drink alcohol while taking  Flexeril.  I feel that patient is requiring further workup and primary care, and requires close follow-up.  Patient likely premenopausal.  Involve case management, who got patient an appointment at [facility].  Patient is in understanding and agree with the plan of care. ED Discharge Orders    None       Delia Chimes 09/30/17 1146    Little, Ambrose Finland, MD 10/01/17 [phone]

## 2017-09-30 NOTE — ED Triage Notes (Signed)
To ED for eval of congestion, pain behind both ears, and mild HA for past week. No cough. No fevers noted.

## 2017-09-30 NOTE — ED Notes (Signed)
Pt reports that she is having pain and inflammation in her neck and shoulders, denies injury

## 2017-09-30 NOTE — Discharge Instructions (Signed)
Please see the information and instructions below regarding your visit.  Your diagnoses today include:  1. Trapezius muscle strain, unspecified laterality, initial encounter   2. Neck pain   3. Nasal congestion    Examination is reassuring today.  Muscle pain appears to be caused by musculoskeletal strain.  We will treat this with muscle relaxants as well as salon Pas patches.  This can be picked up over-the-counter at the pharmacy.  Tests performed today include: See side panel of your discharge paperwork for testing performed today. Vital signs are listed at the bottom of these instructions.   Medications prescribed:    Take any prescribed medications only as prescribed, and any over the counter medications only as directed on the packaging.  You are prescribed Flexeril, a muscle relaxant. Some common side effects of this medication include:  Feeling sleepy.  Dizziness. Take care upon going from a seated to a standing position.  Dry mouth.  Feeling tired or weak.  Hard stools (constipation).  Upset stomach. These are not all of the side effects that may occur. If you have questions about side effects, call your doctor. Call your primary care provider for medical advice about side effects.  This medication can be sedating. Only take this medication as needed. Please do not combine with alcohol. Do not drive or operate machinery while taking this medication.   This medication can interact with some other medications. Make sure to tell any provider you are taking this medication before they prescribe you a new medication.   You are prescribed ibuprofen, a non-steroidal anti-inflammatory agent (NSAID) for pain. You may take 600mg  every 6 hours as needed for pain. If still requiring this medication around the clock for acute pain after 10 days, please see your primary healthcare provider.  Women who are pregnant, breastfeeding, or planning on becoming pregnant should not take  non-steroidal anti-inflammatories such as   You may combine this medication with Tylenol, 650 mg every 6 hours, so you are receiving something for pain every 3 hours.  This is not a long-term medication unless under the care and direction of your primary provider. Taking this medication long-term and not under the supervision of a healthcare provider could increase the risk of stomach ulcers, kidney problems, and cardiovascular problems such as high blood pressure.   Salon PAS patches over the counter  Home care instructions:  Please follow any educational materials contained in this packet.   Follow-up instructions: Please follow-up with your primary care provider ASAP for further evaluation of your symptoms if they are not completely improved.    Return instructions:  Please return to the Emergency Department if you experience worsening symptoms.  Please return emergency department if you have any pain associated with weakness or numbness in your extremities, fever or chills with your symptoms, headaches associated with changes in your level, worsening visual changes or persistent visual changes.  Please return if you have any other emergent concerns.  Additional Information:   Your vital signs today were: BP (!) 154/75 (BP Location: Right Arm)    Pulse 83    Temp 99.1 F (37.3 C) (Oral)    Resp 18    Ht 5' (1.524 m)    Wt 59 kg (130 lb)    SpO2 100%    BMI 25.39 kg/m  If your blood pressure (BP) was elevated on multiple readings during this visit above 130 for the top number or above 80 for the bottom number, please have this repeated  by your primary care provider within one month. --------------  Thank you for allowing us to participate in your care today.  Please have your blood pressure rechecked when you establish primary care.

## 2017-09-30 NOTE — Discharge Planning (Signed)
Amber Witkop J. Lucretia RoersWood, RN, BSN, [place] [phone]  [facility]er [facility] @ 10:00.  Spoke with pt at bedside and advised to please arrive 15 min early and take a picture ID and your current medications.  Pt verbalizes understanding of keeping appointment. EDCM [facility]  to be granted the one-time Rx fill at no charge.  Pt instructed to call back if any problems with process. No further EDCM needs identified.  Amber Crawford J. Lucretia RoersWood, RN, BSN, [place] [phone]

## 2017-10-18 ENCOUNTER — Inpatient Hospital Stay (INDEPENDENT_AMBULATORY_CARE_PROVIDER_SITE_OTHER): Payer: Medicaid Other | Admitting: Physician Assistant

## 2017-11-01 ENCOUNTER — Other Ambulatory Visit: Payer: Self-pay

## 2017-11-01 ENCOUNTER — Encounter (INDEPENDENT_AMBULATORY_CARE_PROVIDER_SITE_OTHER): Payer: Self-pay | Admitting: Physician Assistant

## 2017-11-01 ENCOUNTER — Ambulatory Visit (INDEPENDENT_AMBULATORY_CARE_PROVIDER_SITE_OTHER): Payer: Self-pay | Admitting: Physician Assistant

## 2017-11-01 ENCOUNTER — Other Ambulatory Visit (INDEPENDENT_AMBULATORY_CARE_PROVIDER_SITE_OTHER): Payer: Self-pay | Admitting: Physician Assistant

## 2017-11-01 VITALS — BP 153/86 | HR 83 | Temp 98.5°F | Wt 139.2 lb

## 2017-11-01 DIAGNOSIS — F32A Depression, unspecified: Secondary | ICD-10-CM

## 2017-11-01 DIAGNOSIS — F419 Anxiety disorder, unspecified: Secondary | ICD-10-CM

## 2017-11-01 DIAGNOSIS — F329 Major depressive disorder, single episode, unspecified: Secondary | ICD-10-CM

## 2017-11-01 DIAGNOSIS — S161XXA Strain of muscle, fascia and tendon at neck level, initial encounter: Secondary | ICD-10-CM

## 2017-11-01 MED ORDER — ESCITALOPRAM OXALATE 10 MG PO TABS: 10.0000 mg | ORAL_TABLET | Freq: Every day | ORAL | 2 refills | Status: DC

## 2017-11-01 MED ORDER — DIAZEPAM 2 MG PO TABS: 2.0000 mg | ORAL_TABLET | Freq: Two times a day (BID) | ORAL | 0 refills | Status: DC | PRN
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
## 2017-11-01 NOTE — Patient Instructions (Addendum)
Community Resources  Advocacy/Legal Legal Aid [place]:  1-866-219-5262  /  336-272-0148  Family Justice Center:  336-641-7233  [facility] 24-hr Crisis line:  336-273-7273  Women's Resource Center, [place]:  336-275-6090  Court Watch (custody):  336-275-2346  [facility]:   336-279-9299    Baby & Breastfeeding Car Seat Inspection @ Various [place] Fire Depts.- call [phone]  [facility]  336-832-6860  [facility] 336-878-6712  WIC: 336-641-3663 ([place]);  336-641-7571 ([place])  La Leche League:  1-877-452-5321   Childcare [place] [facility]: 336-369-5097 ([place]) / 336-887-8224 ([place])  - Child Care Resources/ Referrals/ Scholarships  - Head Start/ Early Head Start (call or apply online)  [place] DHHS: [place] Pre-K :  1-800-859-0829 / 336-274-5437   Employment / Job Search [facility] [place]: 336-275-6090 / [address]  [place] [facility] [employer] [facility] (JobLink): 336-373-5922 ([place]) / 336-882-4141 ([place])  [facility]/ Career Center: 336-275-9801 / 336-282-7307  [facility] Job & Career Center: 336-373-3764  DHHS Work First: 336-641-3447 ([place]) / 336-641-3447 ([place])  [facility] [place]:  336-676-5871   Financial Assistance [facility]:  336-553-2657  [employer]: 336-235-0368  [employer] (furniture):  336-370-4002  [place] [facility]: 336-373-4264  Low Income Energy Assistance  336-641-3000   Food Assistance DHHS- SNAP/ Food Stamps: 336-641-4588  WIC: [place]- 336-641-3663 ;  [place] 336-641-7571  Little Green Book- Free Meals  Little Blue Book- Free Food Pantries  During the summer, text "FOOD" to 877877   General Health / Clinics (Adults) Orange Card (for Adults) through [facility]: (336) 895-4900  [facility]:   336-832-8035  [facility]:   336-832-4444  Health Department:  336-641-3245  [facility]:  336-415-3877 / 336-641-2100  [facility] [place]:   336-373-0678  [facility]:   336-334-4822 x 50251   Housing [place] [facility]:   336-691-9521  [place] [facility] Authority:  336-275-8501  Affordable Housing Managemnt:  336-273-0568   Immigrant/ Refugee Center for [place]ians (UNCG):  336-256-1065  Fa[facility]:  336-379-0037  [facility] Institute:  336-937-4701  Church World Services:  336-617-0381  African Services Coalition:  336-574-2677   LGBTQ YouthSAFE  www.youthsafe[place].org  PFLAG  336-541-6754 / info@[place].org  The [place] Project:  1-866-488-7386   Mental Health/ Substance Use [facility]  336-387-6161  [facility]:  336-832-9700 or 1-800-711-2635  [facility] of Care:  336-271-5888  [facility]:  336-294-1349  [facility]:  336-542-2884  [facility] (walk-ins)  336-676-6840 / [address]  [place]:  800-449-1287  Alcoholics Anonymous:  336-854-4278  Narcotics Anonymous:  800-365-1036  Quit Smoking Hotline:  800-QUIT-NOW (800-784-8669)   Parenting Children's Home Society:  800-632-1400  [place]: Education Center & Support Groups:  336-832-6682  YWCA: 336-273-3461  UNCG: Bringing Out the Best:  336-334-3120               Thriving at Three (Hispanic families): 336-256-1066  Healthy Start ([facility]):  336-387-6161 x2288  Parents as Teachers:  336-691-0024  [place] [facility]- Learning Together (Immigrants): 336-369-5001   Poison Control 800-222-1222  Sports & Recreation [employer] Open Doors Application: ymcanwnc.org/join/open-doors-financial-assistance/  City of [place] Recreation Centers: http://www.[place].gov/index.aspx?page=3615   Special Needs Family Support Network:  336-832-6507  Autism Society of :   336-333-0197 x1402 or x1412 /  800-785-1035  [facility] [place]:  336-334-5773     ARC of [place]:  253-685-5033203-269-1062  Children's Developmental Service Agency (CDSA):  250-113-2948201-164-4657  [facility] (Care Coordination for Children):  818-876-3914908-372-3386   Transportation Medicaid Transportation: 4181321529574-243-7150 to apply  [place] Authority: [phone] (reduced-fare bus ID to Medicaid/ Medicare/ Orange Card)  SCAT Paratransit services: Eligible riders only, call [phone] for application   Tutoring/Mentoring Black Child Development Institute: [phone]  [facility] Brothers/ Big Sisters: [phone]([place])  [phone] ([place])  ACES through child's school: [phone]  [employer] Achievers: contact your local Y  SHIELD Mentor Program: [phone]      Trastorno de ansiedad generalizada, en adultos Generalized Anxiety Disorder, Adult El trastorno de ansiedad generalizada (TAG) es un trastorno de salud mental. Las personas con esta afeccin se preocupan constantemente por los eventos de CarMaxtodos los das. A diferencia de la ansiedad normal, la preocupacin relacionada con el TAG no se produce por un evento especfico. Estas preocupaciones tampoco desaparecen ni mejoran con el tiempo. EL TAG interfiere con las funciones de la vida, incluidas las Lisbonrelaciones, el trabajo y Production designer, theatre/television/filmla escuela. El TAG puede variar de leve a grave. Las personas con TAG grave tienen intensas oleadas de ansiedad con sntomas fsicos (crisis de [place]angustia). Cules son las causas? Se desconoce la causa exacta del TAG. Qu incrementa el riesgo? Es ms probable que esta afeccin se manifieste en:  Mujeres.  Las Eli Lilly and Companypersonas que tienen antecedentes familiares de trastornos de Lovingtonansiedad.  Las personas que son Lynnae Sandhoffmuy tmidas.  Las Eli Lilly and Companypersonas que experimentan eventos muy estresantes en la vida, tales como la muerte de un ser querido.  Las personas que tienen un entorno Energy managerfamiliar muy estresante.  Cules son los signos o los sntomas? Con frecuencia, las personas que padecen el TAG se preocupan excesivamente por muchas cosas en la vida,  tales como su salud y Scotiasu familia. Tambin pueden experimentar una preocupacin desmedida por lo siguiente:  Hacer las Solectron Corporationcosas bien.  Llegar a tiempo.  Los W.W. Grainger Incdesastres naturales.  Las Marathon Oilamistades.  Los sntomas fsicos del TAG incluyen:  Fatiga.  Tensin muscular o contracciones musculares.  Temblores o agitacin.  Sobresaltarse con facilidad.  Sentir que el corazn late fuerte o est acelerado.  Sentir falta de aire o como que no se puede respirar profundamente.  Problemas para quedarse dormido o para seguir durmiendo.  Sudoracin.  Nuseas, diarrea o sndrome del intestino irritable (SII).  Dolores de Turkmenistancabeza.  Dificultad para concentrarse o recordar hechos.  Agitacin.  Irritabilidad.  Cmo se diagnostica? El mdico puede diagnosticar el TAG en funcin de los sntomas y los antecedentes mdicos. Se le Medical sales representativerealizar un examen fsico. El mdico le har preguntas especficas sobre sus sntomas, que incluyen qu tan graves son, cundo comenzaron y si Zenaida Niecevan y vienen. Tambin puede hacerle preguntas sobre el consumo de alcohol o drogas, incluidos los medicamentos recetados. Su mdico podr derivarlo a un especialista de salud mental para ms evaluaciones. Su mdico le Charity fundraiserrealizar un examen exhaustivo y puede hacer pruebas adicionales para descartar otras posibles causas de sus sntomas. Para recibir un diagnstico del TAG, una persona debe tener ansiedad que:  Est fuera de su control.  Afecte distintos aspectos de su vida, como el trabajo y Liberty Globallas relaciones.  Cause angustia que le impida participar en sus actividades habituales.  Incluya al menos tres sntomas fsicos del TAG tales como inquietud, fatiga, dificultad para concentrarse, irritabilidad, tensin muscular o problemas para dormir.  Antes de que su mdico pueda confirmar un diagnstico del TAG, estos sntomas deben estar presentes ms Arrow Electronicsdas de los que no lo estn y Insurance claims handlerdeben tener  una duracin de seis meses o ms. Cmo se  trata? Las siguientes terapias se usan generalmente para tratar el TAG:  Medicamentos. Por lo general, los medicamentos antidepresivos se recetan para un control diario a Air cabin crew. Se pueden agregar medicamentos para la ansiedad en casos graves, especialmente cuando ocurren crisis de [place].  Psicoterapia (psicoanlisis). Determinados tipos de psicoterapia pueden ser tiles para tratar el TAG al brindar apoyo, educacin y orientacin. Entre las opciones se incluyen las siguientes: ? Terapia cognitivo conductual (TCC). Las personas aprenden habilidades para sobrellevar situaciones y tcnicas para Technical sales engineer ansiedad. Aprenden a identificar conductas y pensamientos irreales o negativos, y a reemplazarlos por positivos. ? Terapia de aceptacin y compromiso (acceptance and commitment therapy, ACT). Este tratamiento ensea a las personas a ser conscientes como una forma de lidiar con pensamientos y sentimientos no deseados. ? Biorretroalimentacin. Este proceso lo capacita para Scientist, physiological respuesta del cuerpo Publishing copy) a travs de tcnicas de respiracin y mtodos de relajacin. Usted trabajar con un terapeuta mientras se usan mquinas para controlar sus sntomas fsicos.  Tcnicas para Charity fundraiser. Estas incluyen yoga, meditacin y ejercicio.  Un especialista en salud mental puede ayudar a determinar qu tratamiento es el mejor para usted. Algunas Naval architect con solo un tipo de [place]. Sin embargo, Economist requieren una combinacin de terapias. Siga estas instrucciones en su casa:  Tome los medicamentos de venta libre y los recetados solamente como se lo haya indicado el mdico.  Trate de mantener una rutina normal.  Trate de anticipar situaciones estresantes y permita un tiempo adicional para controlarlas.  Participe de cualquier tcnica para autocalmarse o controlar el estrs segn las indicaciones de su mdico.  No se castigue ante los  retrocesos o por no Sports coach.  Trate de reconocer sus logros aunque sean pequeos.  Concurra a todas las visitas de [address] se lo haya indicado el mdico. Esto es importante. Comunquese con un mdico si:  Los sntomas no mejoran.  Los sntomas empeoran.  Tiene signos de depresin tales como: ? Tristeza, mal humor o irritabilidad. ? Ya no disfruta de Industrial/product designer. ? Cambios en el peso y o en sus hbitos de alimentacin. ? Cambios en los hbitos de sueo. ? Evita a amigos y familiares. ? No tiene energa para realizar las tareas habituales. ? Tiene sentimientos de culpa o de minusvala. Solicite ayuda de inmediato si:  Tiene pensamientos serios acerca de lastimarse a usted mismo o daar a Economist. Si alguna vez siente que puede lastimarse o Physicist, medical a los dems, o piensa en poner fin a su vida, busque ayuda de inmediato. Puede dirigirse al servicio de urgencias ms cercano o comunicarse con:  El servicio de Sports administrator de su localidad (911 en los [place]).  Una lnea de asistencia al suicida y Visual merchandiser en crisis, como la Murphy Oil de Prevencin del Suicidio (National Suicide Prevention Lifeline) al [phone]. Est disponible las 24 horas del da.  Resumen  El trastorno de ansiedad generalizada (TAG) es un trastorno de salud mental que implica preocupacin no causada por un evento especfico.  Con frecuencia, las personas que padecen el TAG se preocupan excesivamente por muchas cosas en la vida, tales como su salud y [place].  El TAG puede causar sntomas fsicos tales como inquietud, dificultad para concentrarse, problemas para dormir, sudoracin frecuente, nuseas, diarrea, dolores de [place] y temblores, o contracciones musculares.  Un especialista en salud mental puede ayudar a Financial planner  es el mejor para usted. Algunas Naval architect con solo un tipo de [place]. Sin embargo, Metallurgist requieren una combinacin de terapias. Esta informacin no tiene Theme park manager el consejo del mdico. Asegrese de hacerle al mdico cualquier pregunta que tenga. Document Released: 12/05/2012 Document Revised: 11/13/2016 Document Reviewed: 11/13/2016 Elsevier Interactive Patient Education  [employer].

## 2017-11-03 ENCOUNTER — Telehealth (INDEPENDENT_AMBULATORY_CARE_PROVIDER_SITE_OTHER): Payer: Self-pay

## 2017-11-03 LAB — CBC WITH DIFFERENTIAL/PLATELET
BASOS ABS: 0.1 10*3/uL (ref 0.0–0.2)
Basos: 1 %
EOS (ABSOLUTE): 0.2 10*3/uL (ref 0.0–0.4)
EOS: 3 %
HEMATOCRIT: 44.5 % (ref 34.0–46.6)
HEMOGLOBIN: 14.3 g/dL (ref 11.1–15.9)
IMMATURE GRANS (ABS): 0 10*3/uL (ref 0.0–0.1)
Immature Granulocytes: 0 %
LYMPHS ABS: 2.8 10*3/uL (ref 0.7–3.1)
LYMPHS: 32 %
MCH: 31.4 pg (ref 26.6–33.0)
MCHC: 32.1 g/dL (ref 31.5–35.7)
MCV: 98 fL — ABNORMAL HIGH (ref 79–97)
MONOCYTES: 4 %
Monocytes Absolute: 0.4 10*3/uL (ref 0.1–0.9)
NEUTROS ABS: 5.3 10*3/uL (ref 1.4–7.0)
Neutrophils: 60 %
Platelets: 270 10*3/uL (ref 150–379)
RBC: 4.55 x10E6/uL (ref 3.77–5.28)
RDW: 14.5 % (ref 12.3–15.4)
WBC: 8.8 10*3/uL (ref 3.4–10.8)

## 2017-11-03 LAB — COMPREHENSIVE METABOLIC PANEL
ALBUMIN: 4.5 g/dL (ref 3.5–5.5)
ALT: 13 IU/L (ref 0–32)
AST: 13 IU/L (ref 0–40)
Albumin/Globulin Ratio: 1.7 (ref 1.2–2.2)
Alkaline Phosphatase: 80 IU/L (ref 39–117)
BUN / CREAT RATIO: 19 (ref 9–23)
BUN: 12 mg/dL (ref 6–24)
Bilirubin Total: 0.2 mg/dL (ref 0.0–1.2)
CO2: 19 mmol/L — AB (ref 20–29)
CREATININE: 0.64 mg/dL (ref 0.57–1.00)
Calcium: 9.1 mg/dL (ref 8.7–10.2)
Chloride: 103 mmol/L (ref 96–106)
GFR calc non Af Amer: 103 mL/min/{1.73_m2} (ref >59)
GFR, EST AFRICAN AMERICAN: 119 mL/min/{1.73_m2} (ref >59)
GLOBULIN, TOTAL: 2.6 g/dL (ref 1.5–4.5)
GLUCOSE: 74 mg/dL (ref 65–99)
Potassium: 4 mmol/L (ref 3.5–5.2)
SODIUM: 139 mmol/L (ref 134–144)
Total Protein: 7.1 g/dL (ref 6.0–8.5)

## 2017-11-03 LAB — TSH: TSH: 3.5 u[IU]/mL (ref 0.450–4.500)

## 2017-11-03 NOTE — Telephone Encounter (Signed)
Left patient a message informing that all labs are normal. Tempestt Budd PalmerS Roberts, CMA

## 2017-11-03 NOTE — Telephone Encounter (Signed)
-----   Message from Loletta Specteroger David Gomez, PA-C sent at 11/03/2017  8:44 AM EDT ----- Labs are normal/unremarkable.

## 2017-11-26 ENCOUNTER — Ambulatory Visit: Payer: Medicaid Other

## 2017-12-02 ENCOUNTER — Ambulatory Visit (INDEPENDENT_AMBULATORY_CARE_PROVIDER_SITE_OTHER): Payer: Self-pay | Admitting: Physician Assistant

## 2017-12-02 ENCOUNTER — Other Ambulatory Visit: Payer: Self-pay

## 2017-12-02 ENCOUNTER — Encounter (INDEPENDENT_AMBULATORY_CARE_PROVIDER_SITE_OTHER): Payer: Self-pay | Admitting: Physician Assistant

## 2017-12-02 VITALS — BP 162/83 | HR 77 | Temp 98.5°F | Wt 140.4 lb

## 2017-12-02 DIAGNOSIS — R030 Elevated blood-pressure reading, without diagnosis of hypertension: Secondary | ICD-10-CM

## 2017-12-02 DIAGNOSIS — R232 Flushing: Secondary | ICD-10-CM

## 2017-12-02 DIAGNOSIS — F418 Other specified anxiety disorders: Secondary | ICD-10-CM

## 2017-12-02 MED ORDER — BLACK COHOSH 80 MG PO CAPS: 1.0000 | ORAL_CAPSULE | Freq: Every day | ORAL | 0 refills | Status: DC

## 2017-12-02 NOTE — Patient Instructions (Addendum)
Perimenopausia  (Perimenopause)  La perimenopausia es el momento en que su cuerpo comienza a pasar a la menopausia (sin menstruación durante 12 meses consecutivos). Es un proceso natural. La perimenopausia puede comenzar entre 2 y 8 años antes de la menopausia y por lo general tiene una duración de 1 año más pasada la menopausia. Durante este tiempo, los ovarios podrían producir un óvulo o no. Los ovarios varían su producción de las hormonas estrógeno y progesterona cada mes. Esto puede causar períodos menstruales irregulares, dificultad para quedar embarazada, hemorragia vaginal entre períodos y síntomas incómodos.  CAUSAS  · Producción irregular de las hormonas ováricas estrógeno y progesterona, y no ovular todos los meses.  · Otras causas son:  ? Tumor de la glándula pituitaria.  ? Enfermedades que afectan los ovarios.  ? Radioterapia.  ? Quimioterapia.  ? Causas desconocidas.  ? Fumar mucho y abusar del consumo de alcohol puede llevar a que la perimenopausia aparezca antes.    SIGNOS Y SÍNTOMAS  · Acaloramiento.  · Sudoración nocturna.  · Períodos menstruales irregulares.  · Disminución del deseo sexual.  · Sequedad vaginal.  · Dolores de cabeza.  · Cambios en el estado de ánimo.  · Depresión.  · Problemas de memoria.  · Irritabilidad.  · Cansancio.  · Aumento de peso.  · Problemas para quedar embarazada.  · Pérdida de células óseas (osteoporosis).  · Comienzo de endurecimiento de las arterias (aterosclerosis).    DIAGNÓSTICO  El médico realizará un diagnóstico en función de su edad, historial de períodos menstruales y síntomas. Le realizarán un examen físico para ver si hay algún cambio en su cuerpo, en especial en sus órganos reproductores. Las pruebas hormonales pueden ser o no útiles según la cantidad de hormonas femeninas que produzca y cuándo las produzca. Sin embargo, podrán realizarse otras pruebas hormonales para detectar otros problemas.  TRATAMIENTO   En algunos casos, no se necesita tratamiento. La decisión acerca de qué tratamiento es necesario durante la perimenopausia deberá realizarse en conjunto con su médico según cómo estén afectando los síntomas a su estilo de vida. Existen varios tratamientos disponibles, como:  · Tratar cada síntoma individual con medicamentos específicos para ese síntoma.  · Algunos medicamentos herbales pueden ayudar en síntomas específicos.  · Psicoterapia.  · Terapia grupal.  INSTRUCCIONES PARA EL CUIDADO EN EL HOGAR  · Controle sus periodos menstruales (cuándo ocurren, qué tan abundantes son, cuánto tiempo pasa entre períodos, y cuánto duran) como también sus síntomas y cuándo comenzaron.  · Tome sólo medicamentos de venta libre o recetados, según las indicaciones del médico.  · Duerma y descanse.  · Haga actividad física.  · Consuma una dieta que contenga calcio (bueno para los huesos) y productos derivados de la soja (actúan como estrógenos).  · No fume.  · Evite las bebidas alcohólicas.  · Tome los suplementos vitamínicos según las indicaciones del médico. En ciertos casos, puede ser de ayuda tomar vitamina E.  · Tome suplementos de calcio y vitamina D para ayudar a prevenir la pérdida ósea.  · En algunos casos la terapia de grupo podrá ayudarla.  · La acupuntura puede ser de ayuda en ciertos casos.    SOLICITE ATENCIÓN MÉDICA SI:  · Tiene preguntas acerca de sus síntomas.  · Necesita ser derivada a un especialista (ginecólogo, psiquiatra, o psicólogo).    SOLICITE ATENCIÓN MÉDICA DE INMEDIATO SI:  · Sufre una hemorragia vaginal abundante.  · Su período menstrual dura más de 8 días.  · Sus períodos son recurrentes   Revised: 05/31/2013 Document Reviewed: 03/09/2013 Elsevier Interactive Patient Education  2017 Elsevier Inc.   Vivir con ansiedad Living With Anxiety Despus de haber sido diagnosticado con trastorno de ansiedad, podra sentirse aliviado por comprender por qu se haba sentido o haba actuado de cierto modo. Adems, es natural sentirse abrumado por el tratamiento que tiene por delante y por lo que este significar para su vida. Con atencin y [place] Grenadinesayuda, Tokelaupuede manejar este trastorno y Sales executiverecuperarse. Cmo hacer frente a la ansiedad Enfrentar el estrs El estrs es la reaccin del cuerpo ante los cambios y los acontecimientos de la vida, tanto buenos Sawyercomo malos. El estrs puede durar solo algunas horas o puede ser Eudorapermanente. El estrs puede influir mucho en la ansiedad, por lo que es importante aprender sobre cmo hacerle frente y cmo pensarlo de un modo nuevo. Hable con el mdico o un orientador psicolgico para obtener ms informacin sobre cmo Museum/gallery exhibitions officerreducir el estrs. Podran sugerirle algunas tcnicas para hacerlo, como:  Musicoterapia. Esto podra incluir crear o escuchar msica que disfrute y lo inspire.  Meditacin consciente. Esto implica prestar atencin a la respiracin normal ms que intentar controlarla. Puede realizarse mientras est sentado o camina.  Oracin centrante. Este es un tipo de meditacin que implica centrarse en una palabra, frase o imagen sagrada que le sea representativa y le genere paz.  Respiracin profunda. Para hacer esto, expanda el estmago e inhale lentamente por la nariz. Mantenga el aire durante un lapso de Loganentre 3y5segundos. Luego, exhale lentamente mientras deja que los msculos del estmago se relajen.  Dilogo interno. Se trata de una habilidad por la que usted es capaz de identificar patrones de pensamiento que lo llevan a Warehouse managertener reacciones ansiosas y de corregir dichos pensamientos.  Relajacin muscular. Esto implica tensar los msculos y, [place]at[place]luego,  Ringoesrelajarlos.  Elija una tcnica para reducir el estrs que se adapte a su estilo de vida y su personalidad. Las tcnicas para reducir el estrs llevan tiempo y Multimedia programmerprctica. Resrvese de [id number] por da para Associate Professorhacerlas. Algunos terapeutas pueden ofrecerle capacitacin para aprenderlas. Es posible que algunos planes de seguro mdico cubran la capacitacin. Otras cosas que puede hacer para manejar el estrs:  MidwifeLlevar un registro del estrs. Esto puede ayudarlo a Control and instrumentation engineeridentificar qu le desencadena el estrs y modos de Chief Operating Officercontrolar su reaccin.  Pensar en cmo reacciona ante ciertas situaciones. Es posible que no sea capaz de Chief Operating Officercontrolar todo, pero puede controlar su reaccin.  Hacerse tiempo para las actividades que lo ayudan a Lexicographerrelajarse y no sentir culpa por pasar su tiempo de Ralstoneste modo.  La terapia en combinacin con las habilidades para enfrentar y reducir el estrs proporciona la mejor alternativa para un tratamiento satisfactorio. Medicamentos Los medicamentos pueden ayudar a Asbury Automotive Groupaliviar los sntomas. Algunos medicamentos para la ansiedad:  Programme researcher, broadcasting/film/videoMedicamentos contra la ansiedad.  Antidepresivos.  Betabloqueantes.  Es posible que se requieran medicamentos, junto con la terapia, si otros tratamientos no dieron Randolphresultados. Un mdico debe recetar los medicamentos. Las Hess Corporationrelaciones Las relaciones interpersonales pueden ser muy importantes para ayudar a su recuperacin. Intente pasar ms tiempo interactuando con amigos y familiares de Dominican Republicconfianza. Considere la posibilidad de ir a terapia de pareja, tomar clases de educacin familiar o ir a Information systems managerterapia familiar. La terapia puede ayudarlos a usted y a los dems a comprender mejor el trastorno. Cmo reconocer cambios en el trastorno Todos tienen una respuesta diferente al tratamiento de la ansiedad. Se dice que est recuperado de la ansiedad cuando los sntomas disminuyen y dejan de Producer, television/film/videointerferir en las actividades diarias  en el hogar o el trabajo. Esto podra significar que  usted comenzar a Radio producer lo siguiente:  Dealer y atencin.  Dormir mejor.  Estar menos irritable.  Tener ms energa.  Tener [employer].  Es Public librarian cundo el trastorno Viola. Comunquese con el mdico si sus sntomas interfieren en su hogar o su trabajo, y usted no siente que el trastorno est mejorando. Dnde encontrar ayuda y apoyo: Puede conseguir ayuda y [employer] siguientes lugares:  Grupos de [place].  Organizaciones comunitarias y en lnea.  Un lder espiritual de confianza.  Terapia de pareja.  Clases de educacin familiar.  Terapia familiar.  Siga estas instrucciones en su casa:  Consuma una dieta saludable que incluya abundantes frutas, verduras, cereales integrales, productos lcteos descremados y protenas magras. No consuma muchos alimentos con alto contenido de grasas slidas, azcares agregados o sal.  Actividad fsica. La [employer] de los adultos debe hacer lo siguiente: ? Education officer, environmental, al [place], de actividad fsica por semana. El ejercicio debe aumentar la frecuencia cardaca y Media planner transpirar (ejercicio de intensidad moderada). ? Realizar ejercicios de fortalecimiento por lo [employer] por semana.  Disminuir el consumo de cafena, tabaco, alcohol y otras sustancias potencialmente dainas.  Dormir el tiempo adecuado y de Network engineer. La [employer]son de los adultos necesitan entre 7y9horas de sueo todas las noches.  Opte por cosas que le simplifiquen la vida.  Tome los medicamentos de venta libre y los recetados solamente como se lo haya indicado el mdico.  Evite el consumo de cafena, alcohol y ciertos medicamentos contra el resfro de venta sin receta. Estos podran Optician, dispensing. Pregntele al farmacutico qu medicamentos no debera tomar.  Concurra a todas las visitas de control como se lo haya indicado el mdico. Esto es importante. Preguntas para hacerle al mdico  Me ser til  la terapia?  Con qu frecuencia debo visitar a un mdico para el seguimiento?  Durante cunto tiempo tendr [employer]?  Tienen efectos secundarios a [employer] tomo?  Existe una alternativa que remplace los medicamentos? Comunquese con un mdico si:  Le resulta difcil permanecer concentrado o finalizar las tareas diarias.  Pasa muchas horas por da sintindose preocupado por la vida cotidiana.  La preocupacin le provoca un cansancio extremo.  Comienza a tener dolores de [place] o nuseas, o a sentirse tenso.  Orina ms de lo normal.  Tiene diarrea. Solicite ayuda de inmediato si:  Se le acelera la frecuencia cardaca y Games developer.  Tiene pensamientos acerca de Runner, broadcasting/film/video o daar a Economist. Si alguna vez siente que puede lastimarse o Physicist, medical a los dems, o tiene pensamientos de poner fin a su vida, busque ayuda de inmediato. Puede dirigirse al servicio de urgencias ms cercano o comunicarse con:  El servicio de Sports administrator de su localidad (911 en los [place]).  Una lnea de asistencia al suicida y Visual merchandiser en crisis, como la [employer] de Prevencin del Suicidio (National Suicide Prevention Lifeline) al [phone]. Est disponible las 24 horas del da.  Resumen  Tomar medidas para enfrentar el estrs puede calmarlo.  Los medicamentos no pueden curar los trastornos de [place], Biomedical engineer pueden ayudar a [employer].  Los familiares, los amigos y las parejas pueden tener un lugar importante en su recuperacin del trastorno de ansiedad. Esta informacin no tiene Theme park manager el consejo del mdico. Asegrese de hacerle al mdico cualquier pregunta que tenga. Document Released: 11/17/2016 Document Revised:  11/17/2016 Document Reviewed: 11/17/2016 Elsevier Interactive Patient Education  [employer].

## 2017-12-02 NOTE — [facility] Notes (Signed)
   Subjective:  Patient ID: Amber AschoffNorma Fernandez de Crawford, female    DOB: [birth year]  Age: 52 y.o. MRN: [id number]  CC: GAD  HPI Amber Crawford is a 52 y.o. female with a medical history of tobacco use, BPPV and gastritis presents on f/u for anxiety and depression. Prescribed escitalopram 10 mg. Feeling much better, has more energy, sleeping better, less depressed. Still has some uncontrolled anxiety but feels mildly better. Anxiety triggered mostly by news of incoming natural events, eg storms, tornadoes. Only complaint is of hot flashes during the night. Is not menopausal. Has a regular menstrual cycle each month.       Outpatient Medications Prior to Visit  Medication Sig Dispense Refill  . diazepam (VALIUM) 2 MG tablet Take 1 tablet (2 mg total) by mouth every 12 (twelve) hours as needed for anxiety. 40 tablet 0  . escitalopram (LEXAPRO) 10 MG tablet Take 1 tablet (10 mg total) by mouth daily. 30 tablet 2   No facility-administered medications prior to visit.      ROS Review of Systems  Constitutional: Negative for chills, fever and malaise/fatigue.  Eyes: Negative for blurred vision.  Respiratory: Negative for shortness of breath.   Cardiovascular: Negative for chest pain and palpitations.  Gastrointestinal: Negative for abdominal pain and nausea.  Genitourinary: Negative for dysuria and hematuria.  Musculoskeletal: Negative for joint pain and myalgias.  Skin: Negative for rash.  Neurological: Negative for tingling and headaches.  Psychiatric/Behavioral: Negative for depression. The patient is nervous/anxious.     Objective:  BP (!) 162/83 (BP Location: Left Arm, Patient Position: Sitting, Cuff Size: Normal)   Pulse 77   Temp 98.5 F (36.9 C) (Oral)   Wt 140 lb 6.4 oz (63.7 kg)   LMP 11/05/2017 (Approximate)   SpO2 99%   BMI 27.42 kg/m   BP/Weight 12/02/2017 11/01/2017 09/30/2017  Systolic BP 162 153 128  Diastolic BP 83 86 58  Wt. (Lbs) 140.4 139.2 130  BMI  27.42 27.19 25.39      Physical Exam  Constitutional: She is oriented to person, place, and time.  Well developed, well nourished, NAD, polite  HENT:  Head: Normocephalic and atraumatic.  Eyes: No scleral icterus.  Neck: Normal range of motion. Neck supple. No thyromegaly present.  Pulmonary/Chest: Effort normal.  Musculoskeletal: She exhibits no edema.  Neurological: She is alert and oriented to person, place, and time.  Skin: Skin is warm and dry. No rash noted. No erythema. No pallor.  Psychiatric: Her behavior is normal. Thought content normal.  Somewhat anxious.  Vitals reviewed.    Assessment & Plan:   1. Depression with anxiety - Continue escitalopram 10 mg.  2. Hot flashes - Begin Black Cohosh 80 mg qday  3. Elevated blood-pressure reading, without diagnosis of hypertension - Monitor over time. Treat as hypertension if remaining elevated and good control of anxiety.   Meds ordered this encounter  Medications  . Black Cohosh 80 MG CAPS    Sig: Take 1 capsule (80 mg total) by mouth daily.    Dispense:  30 capsule    Refill:  0    Order Specific Question:   Supervising Provider    Answer:   Quentin AngstJEGEDE, OLUGBEMIGA E [[id number]]    Follow-up: Return in about 2 months (around 02/01/2018) for depression with anxiety.   Loletta Specteroger David Gearlene Godsil PA

## 2018-02-01 ENCOUNTER — Ambulatory Visit (INDEPENDENT_AMBULATORY_CARE_PROVIDER_SITE_OTHER): Payer: Medicaid Other | Admitting: Physician Assistant

## 2018-04-05 ENCOUNTER — Other Ambulatory Visit: Payer: Self-pay

## 2018-04-05 ENCOUNTER — Ambulatory Visit (INDEPENDENT_AMBULATORY_CARE_PROVIDER_SITE_OTHER): Payer: Medicaid Other | Admitting: Physician Assistant

## 2018-04-05 ENCOUNTER — Encounter (INDEPENDENT_AMBULATORY_CARE_PROVIDER_SITE_OTHER): Payer: Self-pay | Admitting: Physician Assistant

## 2018-04-05 ENCOUNTER — Ambulatory Visit (INDEPENDENT_AMBULATORY_CARE_PROVIDER_SITE_OTHER): Payer: Self-pay | Admitting: Physician Assistant

## 2018-04-05 VITALS — BP 129/86 | HR 73 | Temp 98.1°F | Ht 60.0 in | Wt 134.8 lb

## 2018-04-05 DIAGNOSIS — Z1231 Encounter for screening mammogram for malignant neoplasm of breast: Secondary | ICD-10-CM

## 2018-04-05 DIAGNOSIS — R350 Frequency of micturition: Secondary | ICD-10-CM

## 2018-04-05 DIAGNOSIS — M545 Low back pain, unspecified: Secondary | ICD-10-CM

## 2018-04-05 DIAGNOSIS — Z1211 Encounter for screening for malignant neoplasm of colon: Secondary | ICD-10-CM

## 2018-04-05 DIAGNOSIS — Z23 Encounter for immunization: Secondary | ICD-10-CM

## 2018-04-05 DIAGNOSIS — H8112 Benign paroxysmal vertigo, left ear: Secondary | ICD-10-CM

## 2018-04-05 DIAGNOSIS — Z114 Encounter for screening for human immunodeficiency virus [HIV]: Secondary | ICD-10-CM

## 2018-04-05 DIAGNOSIS — Z1239 Encounter for other screening for malignant neoplasm of breast: Secondary | ICD-10-CM

## 2018-04-05 LAB — POCT URINALYSIS DIPSTICK
Bilirubin, UA: NEGATIVE
GLUCOSE UA: NEGATIVE
Ketones, UA: NEGATIVE
Leukocytes, UA: NEGATIVE
Nitrite, UA: NEGATIVE
Protein, UA: NEGATIVE
Spec Grav, UA: 1.02 (ref 1.010–1.025)
Urobilinogen, UA: 0.2 E.U./dL
pH, UA: 6 (ref 5.0–8.0)

## 2018-04-05 MED ORDER — MECLIZINE HCL 25 MG PO TABS: 25.0000 mg | ORAL_TABLET | Freq: Two times a day (BID) | ORAL | 0 refills | Status: DC | PRN

## 2018-04-05 MED ORDER — TAMSULOSIN HCL 0.4 MG PO CAPS: 0.4000 mg | ORAL_CAPSULE | Freq: Every day | ORAL | 0 refills | Status: DC

## 2018-04-05 MED ORDER — NAPROXEN 500 MG PO TABS: 500.0000 mg | ORAL_TABLET | Freq: Two times a day (BID) | ORAL | 0 refills | Status: DC

## 2018-04-05 NOTE — [facility] Notes (Signed)
Subjective:  Patient ID: Amber Crawford, female    DOB: [birth year]  Age: 52 y.o. MRN: [id number]  CC: back pain  HPI  Rica Mastorma Fernandez de Lujanis a 52 y.o.femalewith a medical history of anxiety, depression, tobacco use, BPPV and gastritis presents with with four day history of left lower back and left flank pain. Has noted LLQ discomfort and urinary frequency also. Has taken Aleve, Azo, and used pain patches on the back. Upon questioning, pt has no side effects with Aleve use despite aspirin allergy. Does not endorse fever, chills, nausea, vomiting, dysuria, vaginal discharge, or genital lesions. Also states she has occasional episodes of vertigo. No associated loss of audition or tinnitus. Does not endorse any other symptoms or complaints.     Outpatient Medications Prior to Visit  Medication Sig Dispense Refill  . escitalopram (LEXAPRO) 10 MG tablet Take 1 tablet (10 mg total) by mouth daily. 30 tablet 2  . Black Cohosh 80 MG CAPS Take 1 capsule (80 mg total) by mouth daily. (Patient not taking: Reported on 04/05/2018) 30 capsule 0   No facility-administered medications prior to visit.      ROS Review of Systems  Constitutional: Negative for chills, fever and malaise/fatigue.  Eyes: Negative for blurred vision.  Respiratory: Negative for shortness of breath.   Cardiovascular: Negative for chest pain and palpitations.  Gastrointestinal: Positive for abdominal pain. Negative for nausea.  Genitourinary: Positive for frequency. Negative for dysuria and hematuria.  Musculoskeletal: Positive for back pain. Negative for joint pain and myalgias.  Skin: Negative for rash.  Neurological: Positive for dizziness. Negative for tingling and headaches.  Psychiatric/Behavioral: Negative for depression. The patient is not nervous/anxious.     Objective:  BP 129/86 (BP Location: Right Arm, Patient Position: Sitting, Cuff Size: Normal)   Pulse 73   Temp 98.1 F (36.7 C) (Oral)    Ht 5' (1.524 m)   Wt 134 lb 12.8 oz (61.1 kg)   LMP 03/29/2018 (Approximate)   SpO2 97%   BMI 26.33 kg/m   BP/Weight 04/05/2018 12/02/2017 11/01/2017  Systolic BP 129 162 153  Diastolic BP 86 83 86  Wt. (Lbs) 134.8 140.4 139.2  BMI 26.33 27.42 27.19      Physical Exam  Constitutional: She is oriented to person, place, and time.  Well developed, well nourished, NAD, polite  HENT:  Head: Normocephalic and atraumatic.  Eyes: No scleral icterus.  Neck: Normal range of motion. Neck supple. No thyromegaly present.  Cardiovascular: Normal rate, regular rhythm and normal heart sounds.  Pulmonary/Chest: Effort normal and breath sounds normal.  Abdominal: Soft. Bowel sounds are normal. There is tenderness (mild LLQ TTP).  Genitourinary:  Genitourinary Comments: No CVA tenderness bilaterally  Musculoskeletal: She exhibits no edema.  Full aROM of the back.  Neurological: She is alert and oriented to person, place, and time.  Skin: Skin is warm and dry. No rash noted. No erythema. No pallor.  Psychiatric: She has a normal mood and affect. Her behavior is normal. Thought content normal.  Vitals reviewed.    Assessment & Plan:    1. Acute left-sided low back pain without sciatica - Urinalysis Dipstick moderate blood. Suspect patient may have passed a small stone. - Begin tamsulosin (FLOMAX) 0.4 MG CAPS capsule; Take 1 capsule (0.4 mg total) by mouth daily.  Dispense: 30 capsule; Refill: 0 - Begin naproxen (NAPROSYN) 500 MG tablet; Take 1 tablet (500 mg total) by mouth 2 (two) times daily with a meal.  Dispense: 20  tablet; Refill: 0  2. Urinary frequency - Urinalysis Dipstick with moderate blood. Will consider further testing if no better with treatment.  3. Need for Tdap vaccination - Tdap vaccine greater than or equal to 7yo IM  4. Special screening for malignant neoplasms, colon - Fecal occult blood, imunochemical  5. Screening for breast cancer - MM DIGITAL SCREENING  BILATERAL; Future  6. Screening for HIV (human immunodeficiency virus) - HIV antibody  7. Benign paroxysmal positional vertigo of left ear - I personally instructed pt on how to perform at home Epley maneuver.    Meds ordered this encounter  Medications  . tamsulosin (FLOMAX) 0.4 MG CAPS capsule    Sig: Take 1 capsule (0.4 mg total) by mouth daily.    Dispense:  30 capsule    Refill:  0    Order Specific Question:   Supervising Provider    Answer:   Hoy RegisterNEWLIN, ENOBONG [[id number]]  . naproxen (NAPROSYN) 500 MG tablet    Sig: Take 1 tablet (500 mg total) by mouth 2 (two) times daily with a meal.    Dispense:  20 tablet    Refill:  0    Order Specific Question:   Supervising Provider    Answer:   Hoy RegisterNEWLIN, ENOBONG [[id number]]  . meclizine (ANTIVERT) 25 MG tablet    Sig: Take 1 tablet (25 mg total) by mouth 2 (two) times daily as needed for dizziness.    Dispense:  30 tablet    Refill:  0    Order Specific Question:   Supervising Provider    Answer:   Hoy RegisterNEWLIN, ENOBONG [[id number]]    Follow-up: Return if symptoms worsen or fail to improve.   Loletta Specteroger David Gomez PA

## 2018-04-05 NOTE — Patient Instructions (Signed)
Maniobra de Epley, cuidado personal  (Epley Maneuver Self-Care)  QU ES LA MANIOBRA DE EPLEY?  La maniobra de Epley es un ejercicio que puede realizar para aliviar los sntomas del vrtigo posicional paroxstico benigno (VPPB). Esta afeccin a menudo se conoce como vrtigo. El movimiento de unos pequeos cristales (canalculos) dentro del odo interno ocasiona el VPPB. La acumulacin y el movimiento de los canalculos en el odo interno ocasionan una repentina sensacin de aceleracin (vrtigo) cuando se mueve la cabeza en ciertas posiciones. El vrtigo por lo general dura unos 30das. El VPPB normalmente ocurre solo en un odo. Si siente vrtigo cuando se recuesta sobre el lado izquierdo, probablemente tenga VPPB en el odo izquierdo. El mdico le puede decir qu odo est involucrado.  Una lesin en la cabeza puede ocasionar el VPPB. Muchas personas de ms de 50aos tienen VPPB por motivos desconocidos. Si se le diagnostic VPPB, el mdico puede ensearle a realizar esta maniobra. El VPPB no es potencialmente mortal (benigno) y normalmente se pasa con el tiempo.  CUNDO DEBO REALIZAR LA MANIOBRA DE EPLEY?  Puede realizar esta maniobra en su casa cuando tenga los sntomas de vrtigo. Puede realizar la maniobra de Epley hasta 3veces en un da hasta que los sntomas de vrtigo desaparezcan.  CMO DEBO REALIZAR LA MANIOBRA DE EPLEY?  1. Sintese en el borde de una cama o una mesa con la espalda recta. Las piernas deben estar extendidas o colgando sobre el borde de la cama o la mesa.  2. Gire la cabeza a medias hacia el lado del odo afectado.  3. Recustese hacia atrs con la cabeza girada hasta que se encuentre recostado sobre la espalda. Quizs quiera colocar una almohada debajo de los hombros.  4. Mantenga esta posicin durante 30segundos. Es posible que experimente un ataque de vrtigo. Esto es normal. Mantenga esta posicin hasta que el vrtigo desaparezca.  5. Luego gire la cabeza en direccin opuesta  hasta que el odo no afectado est orientado al suelo.  6. Mantenga esta posicin durante 30segundos. Es posible que experimente un ataque de vrtigo. Esto es normal. Mantenga esta posicin hasta que el vrtigo desaparezca.  7. Ahora gire todo el cuerpo hacia el mismo lado que la cabeza. Mantenga esta posicin durante otros 30segundos.  8. Luego, vuelva a sentarse.    ESTA MANIOBRA PRESENTA RIESGOS?  En algunos casos, puede tener otros sntomas (como cambios en la visin, debilidad o entumecimiento). Si tiene estos sntomas, deje de realizar la maniobra y llame al mdico. Incluso si realizar esta maniobra lo alivia del vrtigo, es posible que sienta mareos. El mareo es la sensacin de desvanecimiento pero sin la sensacin de movimiento. Aunque la maniobra de Epley lo alivie del vrtigo, es posible que los sntomas vuelvan durante los siguientes 5aos.  QU DEBO HACER DESPUS DE ESTA MANIOBRA?  Puede retomar sus actividades normales despus de realizar la maniobra de Epley. Pregntele al mdico si debe hacer algo en su casa para prevenir el vrtigo. Esta puede incluir:   Dormir con dos o ms almohadas para mantener la cabeza elevada.   No dormir sobre el lado del odo afectado.   Levantarse lentamente de la cama.   Evitar los movimientos repentinos durante el da.   Evitar los movimientos de cabeza intensos, como mirar hacia arriba o agacharse.   Utilizar un collar cervical para evitar los movimientos de cabeza repentinos.  QU DEBO HACER SI LOS SNTOMAS EMPEORAN?  Llame al mdico si el vrtigo empeora. Llame al mdico inmediatamente si   tiene otros sntomas, incluidos:   Nuseas.   Vmitos.   Dolor de cabeza.   Debilidad.   Entumecimiento.   Cambios en la visin.  Esta informacin no tiene como fin reemplazar el consejo del mdico. Asegrese de hacerle al mdico cualquier pregunta que tenga.  Document Released: 08/15/2013 Document Revised: 08/15/2013 Document Reviewed: 07/04/2013  Elsevier  Interactive Patient Education  2017 Elsevier Inc.

## 2018-04-06 ENCOUNTER — Emergency Department (HOSPITAL_COMMUNITY)
Admission: EM | Admit: 2018-04-06 | Discharge: 2018-04-06 | Disposition: A | Discharge status: Home / Self Care | Payer: Self-pay | Attending: Emergency Medicine | Admitting: Emergency Medicine

## 2018-04-06 ENCOUNTER — Other Ambulatory Visit: Payer: Self-pay

## 2018-04-06 ENCOUNTER — Emergency Department (HOSPITAL_COMMUNITY): Payer: Self-pay

## 2018-04-06 ENCOUNTER — Encounter (HOSPITAL_COMMUNITY): Payer: Self-pay | Admitting: Emergency Medicine

## 2018-04-06 DIAGNOSIS — R1032 Left lower quadrant pain: Secondary | ICD-10-CM | POA: Insufficient documentation

## 2018-04-06 DIAGNOSIS — R109 Unspecified abdominal pain: Secondary | ICD-10-CM

## 2018-04-06 DIAGNOSIS — Z79899 Other long term (current) drug therapy: Secondary | ICD-10-CM | POA: Insufficient documentation

## 2018-04-06 DIAGNOSIS — F17210 Nicotine dependence, cigarettes, uncomplicated: Secondary | ICD-10-CM | POA: Insufficient documentation

## 2018-04-06 LAB — BASIC METABOLIC PANEL
Anion gap: 6 (ref 5–15)
BUN: 8 mg/dL (ref 6–20)
CO2: 25 mmol/L (ref 22–32)
Calcium: 8.7 mg/dL — ABNORMAL LOW (ref 8.9–10.3)
Chloride: 106 mmol/L (ref 98–111)
Creatinine, Ser: 0.52 mg/dL (ref 0.44–1.00)
GFR calc Af Amer: >60 mL/min (ref >60)
GFR calc non Af Amer: >60 mL/min (ref >60)
Glucose, Bld: 114 mg/dL — ABNORMAL HIGH (ref 70–99)
Potassium: 3.8 mmol/L (ref 3.5–5.1)
Sodium: 137 mmol/L (ref 135–145)

## 2018-04-06 LAB — URINALYSIS, ROUTINE W REFLEX MICROSCOPIC
Bilirubin Urine: NEGATIVE
Glucose, UA: NEGATIVE mg/dL
Ketones, ur: NEGATIVE mg/dL
Leukocytes, UA: NEGATIVE
Nitrite: NEGATIVE
Protein, ur: NEGATIVE mg/dL
Specific Gravity, Urine: 1.003 — ABNORMAL LOW (ref 1.005–1.030)
pH: 7 (ref 5.0–8.0)

## 2018-04-06 LAB — CBC WITH DIFFERENTIAL/PLATELET
Abs Immature Granulocytes: 0 10*3/uL (ref 0.0–0.1)
Basophils Absolute: 0.1 10*3/uL (ref 0.0–0.1)
Basophils Relative: 1 %
Eosinophils Absolute: 0.2 10*3/uL (ref 0.0–0.7)
Eosinophils Relative: 2 %
HCT: 41.7 % (ref 36.0–46.0)
Hemoglobin: 13.7 g/dL (ref 12.0–15.0)
Immature Granulocytes: 0 %
Lymphocytes Relative: 35 %
Lymphs Abs: 3 10*3/uL (ref 0.7–4.0)
MCH: 31.4 pg (ref 26.0–34.0)
MCHC: 32.9 g/dL (ref 30.0–36.0)
MCV: 95.6 fL (ref 78.0–100.0)
Monocytes Absolute: 0.6 10*3/uL (ref 0.1–1.0)
Monocytes Relative: 7 %
Neutro Abs: 4.7 10*3/uL (ref 1.7–7.7)
Neutrophils Relative %: 55 %
Platelets: 258 10*3/uL (ref 150–400)
RBC: 4.36 MIL/uL (ref 3.87–5.11)
RDW: 13.2 % (ref 11.5–15.5)
WBC: 8.6 10*3/uL (ref 4.0–10.5)

## 2018-04-06 LAB — HIV ANTIBODY (ROUTINE TESTING W REFLEX): HIV SCREEN 4TH GENERATION: NONREACTIVE

## 2018-04-06 MED ORDER — KETOROLAC TROMETHAMINE 30 MG/ML IJ SOLN
30.0000 mg | Freq: Once | INTRAMUSCULAR | Status: AC
  Administered 2018-04-06: 30 mg via INTRAMUSCULAR
  Filled 2018-04-06: qty 1

## 2018-04-06 MED ORDER — TRAMADOL HCL 50 MG PO TABS: 50.0000 mg | ORAL_TABLET | Freq: Four times a day (QID) | ORAL | 0 refills | Status: DC | PRN

## 2018-04-06 NOTE — ED Notes (Signed)
ED Provider at bedside. 

## 2018-04-06 NOTE — ED Notes (Signed)
Patient transported to CT 

## 2018-04-06 NOTE — ED Notes (Signed)
After discussion. The pt expressed that she did not know how to take the medication prescribed. She has only been taking one pill instead of the BID dose schedule. Today her last admin was 0900 which she only took one pill. She was unaware that she could take the medications together. This RN provided education and gave patient water to take her Flomax and Meclizine.

## 2018-04-06 NOTE — Discharge Instructions (Addendum)
Please read attached information. If you experience any new or worsening signs or symptoms please return to the emergency room for evaluation. Please follow-up with your primary care provider or specialist as discussed. Please use medication prescribed only as directed and discontinue taking if you have any concerning signs or symptoms.   °

## 2018-04-06 NOTE — ED Triage Notes (Signed)
Pt presents with ongoing left side flank and back pain for 8 days. She reports urinary frequency. She is nauseated as well. Seen by pcp yesterday prescribed meds (at beside) but no relief. Denies fever.

## 2018-04-06 NOTE — ED Notes (Signed)
Patient transported to CT 

## 2018-04-06 NOTE — ED Provider Notes (Signed)
[facility]   CSN: [id number] Arrival date & time: 04/06/18  1044   History   Chief Complaint Chief Complaint  Patient presents with  . Flank Pain    HPI Amber Crawford is a 52 y.o. female.  HPI    Spanish interpreter used  52 year old female presents today with complaints of left-sided flank pain. She has approximately 1 week history of left flank pain radiating into her left lower quadrant. Patient does note some urinary frequency, but denies dysuria. She denies any fever,nausea, vomiting, diarrhea constipation, vaginal bleeding, or discharge. She has a history of kidney stones in the past and notes this feels different than prior episodes. Patient was seen by her primary care provider yesterday, she was given Flomax and naproxen which has not improved her symptoms.   Past Medical History:  Diagnosis Date  . Gastritis     There are no active problems to display for this patient.   Past Surgical History:  Procedure Laterality Date  . CESAREAN SECTION    . CHOLECYSTECTOMY       OB History    Gravida  5   Para      Term      Preterm      AB  1   Living  4     SAB      TAB  1   Ectopic      Multiple      Live Births               Home Medications    Prior to Admission medications   Medication Sig Start Date End Date Taking? Authorizing Provider  Black Cohosh 80 MG CAPS Take 1 capsule (80 mg total) by mouth daily. Patient not taking: Reported on 04/05/2018 12/02/17   Loletta SpecterGomez, Roger David, PA-C  escitalopram (LEXAPRO) 10 MG tablet Take 1 tablet (10 mg total) by mouth daily. 11/01/17   Loletta SpecterGomez, Roger David, PA-C  meclizine (ANTIVERT) 25 MG tablet Take 1 tablet (25 mg total) by mouth 2 (two) times daily as needed for dizziness. 04/05/18   Loletta SpecterGomez, Roger David, PA-C  naproxen (NAPROSYN) 500 MG tablet Take 1 tablet (500 mg total) by mouth 2 (two) times daily with a meal. 04/05/18   Loletta SpecterGomez, Roger  David, PA-C  tamsulosin [facility](FLOMAX) 0.4 MG CAPS capsule Take 1 capsule (0.4 mg total) by mouth daily. 04/05/18   Loletta SpecterGomez, Roger David, PA-C  traMADol (ULTRAM) 50 MG tablet Take 1 tablet (50 mg total) by mouth every 6 (six) hours as needed. 04/06/18   Eyvonne MechanicHedges, Jari Carollo, PA-C    Family History No family history on file.  Social History Social History   Tobacco Use  . Smoking status: Current Some Day Smoker    Packs/day: 0.50    Types: Cigarettes  . Smokeless tobacco: Never Used  Substance Use Topics  . Alcohol use: No  . Drug use: No     Allergies   Aspirin   Review of Systems Review of Systems  All other systems reviewed and are negative.    Physical Exam Updated Vital Signs BP 129/71   Pulse 67   Temp 98.9 F (37.2 C) (Oral)   Resp 14   Ht 5' (1.524 m)   Wt 61.2 kg   LMP 03/29/2018 (Approximate)   SpO2 100%   BMI 26.37 kg/m   Physical Exam  Constitutional: She is oriented to person, place, and time. She appears well-developed and well-nourished.  HENT:  Head: Normocephalic and atraumatic.  Eyes: Pupils are equal, round, and reactive to light. Conjunctivae are normal. Right eye exhibits no discharge. Left eye exhibits no discharge. No scleral icterus.  Neck: Normal range of motion. No JVD present. No tracheal deviation present.  Pulmonary/Chest: Effort normal. No stridor.  Abdominal:  Minimal left lower mid abdominal tenderness, no rebound or guarding, remainder abdomen soft nontender  Musculoskeletal:  Back atraumatic non-tender to palpation  Neurological: She is alert and oriented to person, place, and time. Coordination normal.  Psychiatric: She has a normal mood and affect. Her behavior is normal. Judgment and thought content normal.  Nursing note and vitals reviewed.    ED Treatments / Results  Labs (all labs ordered are listed, but only abnormal results are displayed) Labs Reviewed  BASIC METABOLIC PANEL - Abnormal; Notable for the following components:        Result Value   Glucose, Bld 114 (*)    Calcium 8.7 (*)    All other components within normal limits  URINALYSIS, ROUTINE W REFLEX MICROSCOPIC - Abnormal; Notable for the following components:   Color, Urine STRAW (*)    Specific Gravity, Urine 1.003 (*)    Hgb urine dipstick SMALL (*)    Bacteria, UA RARE (*)    All other components within normal limits  CBC WITH DIFFERENTIAL/PLATELET    EKG None  Radiology Ct Renal Stone Study  Result Date: 04/06/2018 CLINICAL DATA:  Left flank pain EXAM: CT ABDOMEN AND PELVIS WITHOUT CONTRAST TECHNIQUE: Multidetector CT imaging of the abdomen and pelvis was performed following the standard protocol without IV contrast. COMPARISON:  None. FINDINGS: Lower chest: Lung bases clear Hepatobiliary: Cholecystectomy clips.  Normal liver and bile ducts Pancreas: Negative Spleen: Negative Adrenals/Urinary Tract: 16 mm left upper pole hypodensity, incompletely evaluated on this study. Multiple small calcifications in the renal papilla on the right compatible with mild chondrocalcinosis. No calculi on the left. No renal obstruction or ureteral calculus. Normal bladder. Stomach/Bowel: Negative for bowel obstruction. No bowel mass or edema. Diverticulosis in the left colon and sigmoid colon without diverticulitis. Normal appendix. Vascular/Lymphatic: Negative Reproductive: Normal uterus. No pelvic mass. Left ovarian cyst 24 mm. Other: Negative for free fluid Musculoskeletal: Negative IMPRESSION: 1. 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 No renal obstruction. 2. Postop cholecystectomy 3. Diverticulosis left colon without diverticulitis. Electronically Signed   By: Marlan Palauharles  Clark M.D.   On: 04/06/2018 14:50    Procedures Procedures (including critical care time)  Medications Ordered in ED Medications  ketorolac (TORADOL) 30 MG/ML injection 30 mg (30 mg Intramuscular Given 04/06/18 1326)     Initial Impression / Assessment and Plan / ED Course  I have  reviewed the triage vital signs and the nursing notes.  Pertinent labs & imaging results that were available during my care of the patient were reviewed by me and considered in my medical decision making (see chart for details).     Labs: CBC, BMP, urinalysis   Imaging:  Consults:  Therapeutics:  Discharge Meds: ultram   Assessment/Plan: 52 year old female presents to with left-sided flank and abdominal pain. Patient is well-appearing and in no acute distress. She has minimal tenderness to palpation of the left lower abdomen and flank region. She has reassuring workup here with reassuring vital signs with no elevation in white count, laboratory evaluation with no significant abnormalities and CT with no acute abnormalities. No suspicion for infectious etiology, no signs of ureterolithiasis. Patient having no pelvic complaints here. Patient encouraged to  continue using naproxen,, follow up with primary care for reevaluation, strict return precautions given. She verbalized understanding and agreement to today's plan.   Final Clinical Impressions(s) / ED Diagnoses   Final diagnoses:  Flank pain    ED Discharge Orders         Ordered    traMADol (ULTRAM) 50 MG tablet  Every 6 hours PRN     04/06/18 1600           Eyvonne Mechanic, PA-C 04/06/18 1602    Long, Arlyss Repress, MD 04/07/18 [phone]

## 2018-04-07 ENCOUNTER — Telehealth (INDEPENDENT_AMBULATORY_CARE_PROVIDER_SITE_OTHER): Payer: Self-pay

## 2018-04-07 NOTE — Telephone Encounter (Signed)
-----   Message from Loletta Specteroger David Gomez, PA-C sent at 04/06/2018 12:47 PM EDT ----- HIV negative.

## 2018-04-07 NOTE — Telephone Encounter (Signed)
Call placed using [employer] interpreter [phone]([phone]) left patient a voicemail notifying of negative HIV. Return call to [facility] at [phone] with any questions or concerns. Maryjean Mornempestt S Shawn Carattini, CMA

## 2018-05-03 ENCOUNTER — Ambulatory Visit (INDEPENDENT_AMBULATORY_CARE_PROVIDER_SITE_OTHER): Payer: Medicaid Other | Admitting: Physician Assistant

## 2019-03-09 ENCOUNTER — Ambulatory Visit (INDEPENDENT_AMBULATORY_CARE_PROVIDER_SITE_OTHER): Payer: Medicaid Other | Admitting: Primary Care

## 2019-03-09 ENCOUNTER — Encounter (INDEPENDENT_AMBULATORY_CARE_PROVIDER_SITE_OTHER): Payer: Self-pay | Admitting: Primary Care

## 2019-03-09 ENCOUNTER — Other Ambulatory Visit: Payer: Self-pay

## 2019-03-09 DIAGNOSIS — R42 Dizziness and giddiness: Secondary | ICD-10-CM

## 2019-03-09 DIAGNOSIS — R030 Elevated blood-pressure reading, without diagnosis of hypertension: Secondary | ICD-10-CM

## 2019-03-09 DIAGNOSIS — R110 Nausea: Secondary | ICD-10-CM | POA: Diagnosis not present

## 2019-03-09 MED ORDER — MECLIZINE HCL 25 MG PO TABS: 25.0000 mg | ORAL_TABLET | Freq: Two times a day (BID) | ORAL | 0 refills | Status: DC | PRN

## 2019-03-09 NOTE — Progress Notes (Signed)
Virtual Visit via Telephone Note  I connected with Amber Crawford on 03/09/19 at  2:50 PM EDT by telephone and verified that I am speaking with the correct person using two identifiers.   I discussed the limitations, risks, security and privacy concerns of performing an evaluation and management service by telephone and the availability of in person appointments. I also discussed with the patient that there may be a patient responsible charge related to this service. The patient expressed understanding and agreed to proceed.   History of Present Illness: [name] is being seen before dizziness for 2 weeks. When driving looking at the yellow line on the road. Just received new glasses 8 days ago. When she bends over.   Observations/Objective: Review of Systems  Gastrointestinal: Positive for nausea.  Neurological: Positive for dizziness.    Assessment and Plan: [name] was seen today for dizziness.  Diagnoses and all orders for this visit:  Elevated blood-pressure reading, without diagnosis of hypertension She denies shortness of breath, headaches, chest pain or lower extremity edema. Not on any medication. Unable to take Bp at home follow up in person for vital signs and reevaluate treatment.  Vertigo Unknown etiology but it is related to positional chances. Advised to maintain balance before movement takes place.  meclizine (ANTIVERT) 25 MG tablet; Take 1 tablet (25 mg total) by mouth 2 (two) times daily as needed for dizziness.  Nausea Unknown etiology maybe secondary to vertigo.   Other orders -     meclizine (ANTIVERT) 25 MG tablet; Take 1 tablet (25 mg total) by mouth 2 (two) times daily as needed for dizziness.    Follow Up Instructions:    I discussed the assessment and treatment plan with the patient. The patient was provided an opportunity to ask questions and all were answered. The patient agreed with the plan and demonstrated an understanding of the  instructions.   The patient was advised to call back or seek an in-person evaluation if the symptoms worsen or if the condition fails to improve as anticipated.  I provided 20 minutes of non-face-to-face time during this encounter.   [name], NP

## 2019-03-21 ENCOUNTER — Ambulatory Visit (INDEPENDENT_AMBULATORY_CARE_PROVIDER_SITE_OTHER): Payer: Medicaid Other | Admitting: Primary Care

## 2019-03-21 ENCOUNTER — Other Ambulatory Visit: Payer: Self-pay

## 2019-03-21 ENCOUNTER — Encounter (INDEPENDENT_AMBULATORY_CARE_PROVIDER_SITE_OTHER): Payer: Self-pay | Admitting: Primary Care

## 2019-03-21 VITALS — Ht 60.0 in

## 2019-03-21 DIAGNOSIS — R42 Dizziness and giddiness: Secondary | ICD-10-CM | POA: Diagnosis not present

## 2019-03-21 DIAGNOSIS — R05 Cough: Secondary | ICD-10-CM

## 2019-03-21 DIAGNOSIS — F419 Anxiety disorder, unspecified: Secondary | ICD-10-CM | POA: Diagnosis not present

## 2019-03-21 DIAGNOSIS — J302 Other seasonal allergic rhinitis: Secondary | ICD-10-CM

## 2019-03-21 DIAGNOSIS — R058 Other specified cough: Secondary | ICD-10-CM

## 2019-03-21 DIAGNOSIS — F17200 Nicotine dependence, unspecified, uncomplicated: Secondary | ICD-10-CM

## 2019-03-21 DIAGNOSIS — F329 Major depressive disorder, single episode, unspecified: Secondary | ICD-10-CM

## 2019-03-21 MED ORDER — LORATADINE 10 MG PO TABS: 10.0000 mg | ORAL_TABLET | Freq: Every day | ORAL | 11 refills | Status: DC

## 2019-03-21 MED ORDER — DM-GUAIFENESIN ER 30-600 MG PO TB12: 1.0000 | ORAL_TABLET | Freq: Two times a day (BID) | ORAL | 1 refills | Status: DC

## 2019-03-21 MED ORDER — ESCITALOPRAM OXALATE 10 MG PO TABS: 10.0000 mg | ORAL_TABLET | Freq: Every day | ORAL | 2 refills | Status: DC

## 2019-03-21 MED ORDER — BUSPIRONE HCL 5 MG PO TABS: 5.0000 mg | ORAL_TABLET | Freq: Two times a day (BID) | ORAL | 3 refills | Status: DC

## 2019-03-21 NOTE — Progress Notes (Signed)
Virtual Visit via Telephone Note  I connected with [name] on 03/21/19 at 10:50 AM EDT by telephone and verified that I am speaking with the correct person using two identifiers.   I discussed the limitations, risks, security and privacy concerns of performing an evaluation and management service by telephone and the availability of in person appointments. I also discussed with the patient that there may be a patient responsible charge related to this service. The patient expressed understanding and agreed to proceed.   History of Present Illness: Amber Crawford [id number] Amber Crawford is being seen today for dizziness, headache , productive  cough and hoarseness. Symptoms have improved with over the counter cough syrup and tylenol.   Past Medical History:  Diagnosis Date  . Gastritis    Observations/Objective: Review of Systems  HENT: Positive for congestion and sore throat.   Respiratory: Positive for sputum production.   Neurological: Positive for dizziness.  Psychiatric/Behavioral: Positive for depression. The patient is nervous/anxious.     Assessment and Plan: [name] was seen today for dizziness and sinusitis.  Diagnoses and all orders for this visit:  Vertigo Positional next visit in person to evaluate ears.Use meclizine as needed  Productive cough Sent in a mucolytic   Anxiety and depression  We discussed options for treatment of anxiety including therapy and/or medication.  Will check basic labs to ensure thyroid is in normal range and that no other metabolic issues are obvious.  Reviewed concept of anxiety as biochemical imbalance of neurotransmitters and rationale for treatment. Discussed potential risks, expected benefits, possible side effects of the medicine. We also discussed how to take it correctly and dosing instructions. If she has any significant side effects to the medicine, she is to stop it and call for advice.  Instructed patient  to contact office or on-call physician promptly should condition worsen or any new symptoms appear.    She was agreeable with this plan.   Spent 25 minutes (>50% of visit) discussing the risks of anxiety disorder, the pathophysiology, etiology, risks, and principles of treatment.  -     busPIRone (BUSPAR) 5 MG tablet; Take 1 tablet (5 mg total) by mouth 2 (two) times daily. -     escitalopram (LEXAPRO) 10 MG tablet; Take 1 tablet (10 mg total) by mouth daily.  Tobacco dependence Nicotine can decrease circulation in your body and affect every organ. Reseach shows increase in lung cancer and respiratory problems. When you are ready to stop let's talk.  Seasonal allergies Added antihistamine symptoms can also be allergy related  Other orders -     dextromethorphan-guaiFENesin (MUCINEX DM) 30-600 MG 12hr tablet; Take 1 tablet by mouth 2 (two) times daily. -     loratadine (CLARITIN) 10 MG tablet; Take 1 tablet (10 mg total) by mouth daily.    Follow Up Instructions:    I discussed the assessment and treatment plan with the patient. The patient was provided an opportunity to ask questions and all were answered. The patient agreed with the plan and demonstrated an understanding of the instructions.   The patient was advised to call back or seek an in-person evaluation if the symptoms worsen or if the condition fails to improve as anticipated.  I provided 18 minutes of non-face-to-face time during this encounter.   [name], NP

## 2019-03-21 NOTE — Progress Notes (Signed)
Voice hoarse since about 4-5 days ago Chest congestion Pt has used cough syrup which she states helped.

## 2019-08-29 ENCOUNTER — Ambulatory Visit (INDEPENDENT_AMBULATORY_CARE_PROVIDER_SITE_OTHER): Payer: Medicaid Other | Admitting: Licensed Clinical Social Worker

## 2019-08-29 ENCOUNTER — Encounter (INDEPENDENT_AMBULATORY_CARE_PROVIDER_SITE_OTHER): Payer: Self-pay | Admitting: Primary Care

## 2019-08-29 ENCOUNTER — Ambulatory Visit (INDEPENDENT_AMBULATORY_CARE_PROVIDER_SITE_OTHER): Payer: Medicaid Other | Admitting: Primary Care

## 2019-08-29 ENCOUNTER — Other Ambulatory Visit: Payer: Self-pay

## 2019-08-29 VITALS — BP 149/85 | HR 72 | Temp 97.8°F | Ht 60.0 in | Wt 131.0 lb

## 2019-08-29 DIAGNOSIS — M542 Cervicalgia: Secondary | ICD-10-CM

## 2019-08-29 DIAGNOSIS — F419 Anxiety disorder, unspecified: Secondary | ICD-10-CM

## 2019-08-29 DIAGNOSIS — F329 Major depressive disorder, single episode, unspecified: Secondary | ICD-10-CM | POA: Diagnosis not present

## 2019-08-29 DIAGNOSIS — F418 Other specified anxiety disorders: Secondary | ICD-10-CM

## 2019-08-29 DIAGNOSIS — R030 Elevated blood-pressure reading, without diagnosis of hypertension: Secondary | ICD-10-CM

## 2019-08-29 DIAGNOSIS — I10 Essential (primary) hypertension: Secondary | ICD-10-CM

## 2019-08-29 DIAGNOSIS — F411 Generalized anxiety disorder: Secondary | ICD-10-CM | POA: Diagnosis not present

## 2019-08-29 MED ORDER — ESCITALOPRAM OXALATE 10 MG PO TABS: 10.0000 mg | ORAL_TABLET | Freq: Every day | ORAL | 1 refills | Status: DC

## 2019-08-29 MED ORDER — DICLOFENAC SODIUM 1 % EX GEL: 4.0000 g | Freq: Four times a day (QID) | CUTANEOUS | 1 refills | Status: DC

## 2019-08-29 MED ORDER — BUSPIRONE HCL 5 MG PO TABS: 5.0000 mg | ORAL_TABLET | Freq: Two times a day (BID) | ORAL | 1 refills | Status: DC

## 2019-08-29 NOTE — Progress Notes (Signed)
Established Patient Office Visit  Subjective:  Patient ID: Amber Crawford, female    DOB: 1966-01-19  Age: 54 y.o. MRN: [id number]  CC:  Chief Complaint  Patient presents with  . Neck Pain    HPI [name] presents for neck pain denies any trauma. She does express increase anxiety and depression. She will follow up with CSW today. Blood pressure remains elevated on last few visit . Denies shortness of breath, headaches, chest pain or lower extremity edema  Past Medical History:  Diagnosis Date  . Gastritis     Past Surgical History:  Procedure Laterality Date  . CESAREAN SECTION    . CHOLECYSTECTOMY      History reviewed. No pertinent family history.  Social History   Socioeconomic History  . Marital status: Single    Spouse name: Not on file  . Number of children: Not on file  . Years of education: Not on file  . Highest education level: Not on file  Occupational History  . Not on file  Tobacco Use  . Smoking status: Current Some Day Smoker    Packs/day: 0.50    Types: Cigarettes  . Smokeless tobacco: Never Used  Substance and Sexual Activity  . Alcohol use: No  . Drug use: No  . Sexual activity: Not on file  Other Topics Concern  . Not on file  Social History Narrative  . Not on file   Social Determinants of Health   Financial Resource Strain:   . Difficulty of Paying Living Expenses: Not on file  Food Insecurity:   . Worried About Programme researcher, broadcasting/film/video in the Last Year: Not on file  . Ran Out of Food in the Last Year: Not on file  Transportation Needs:   . Lack of Transportation (Medical): Not on file  . Lack of Transportation (Non-Medical): Not on file  Physical Activity:   . Days of Exercise per Week: Not on file  . Minutes of Exercise per Session: Not on file  Stress:   . Feeling of Stress : Not on file  Social Connections:   . Frequency of Communication with Friends and Family: Not on file  . Frequency of Social  Gatherings with Friends and Family: Not on file  . Attends Religious Services: Not on file  . Active Member of Clubs or Organizations: Not on file  . Attends Banker Meetings: Not on file  . Marital Status: Not on file  Intimate Partner Violence:   . Fear of Current or Ex-Partner: Not on file  . Emotionally Abused: Not on file  . Physically Abused: Not on file  . Sexually Abused: Not on file    Outpatient Medications Prior to Visit  Medication Sig Dispense Refill  . dextromethorphan-guaiFENesin (MUCINEX DM) 30-600 MG 12hr tablet Take 1 tablet by mouth 2 (two) times daily. (Patient not taking: Reported on 08/29/2019) 30 tablet 1  . loratadine (CLARITIN) 10 MG tablet Take 1 tablet (10 mg total) by mouth daily. (Patient not taking: Reported on 08/29/2019) 30 tablet 11  . busPIRone (BUSPAR) 5 MG tablet Take 1 tablet (5 mg total) by mouth 2 (two) times daily. (Patient not taking: Reported on 08/29/2019) 30 tablet 3  . escitalopram (LEXAPRO) 10 MG tablet Take 1 tablet (10 mg total) by mouth daily. (Patient not taking: Reported on 08/29/2019) 30 tablet 2  . meclizine (ANTIVERT) 25 MG tablet Take 1 tablet (25 mg total) by mouth 2 (two) times daily as  needed for dizziness. (Patient not taking: Reported on 08/29/2019) 30 tablet 0  . tamsulosin (FLOMAX) 0.4 MG CAPS capsule Take 1 capsule (0.4 mg total) by mouth daily. (Patient not taking: Reported on 03/09/2019) 30 capsule 0   No facility-administered medications prior to visit.    Allergies  Allergen Reactions  . Aspirin Other (See Comments)    Fingers get numb; OK to take acetaminophen and IBU    ROS Review of Systems  Musculoskeletal: Positive for neck pain and neck stiffness.  Psychiatric/Behavioral: Positive for sleep disturbance. The patient is nervous/anxious.        Depressed   All other systems reviewed and are negative.     Objective:    Physical Exam  Constitutional: She is oriented to person, place, and time. She appears  well-developed and well-nourished.  HENT:  Head: Normocephalic.  Neck:  Stiffness   Cardiovascular: Normal rate and regular rhythm.  Pulmonary/Chest: Effort normal and breath sounds normal.  Abdominal: Bowel sounds are normal.  Musculoskeletal:        General: Normal range of motion.     Cervical back: Normal range of motion.  Neurological: She is oriented to person, place, and time.  Skin: Skin is warm and dry.  Psychiatric: She has a normal mood and affect. Her behavior is normal. Judgment and thought content normal.    BP (!) 149/85 (BP Location: Right Arm, Patient Position: Sitting, Cuff Size: Normal)   Pulse 72   Temp 97.8 F (36.6 C) (Oral)   Ht 5' (1.524 m)   Wt 131 lb (59.4 kg)   LMP 08/25/2019 (Approximate)   SpO2 100%   BMI 25.58 kg/m  Wt Readings from Last 3 Encounters:  09/20/19 131 lb 9.6 oz (59.7 kg)  08/29/19 131 lb (59.4 kg)  04/06/18 135 lb (61.2 kg)     Health Maintenance Due  Topic Date Due  . PAP SMEAR-Modifier  06/08/1986  . MAMMOGRAM  06/09/2015  . Fecal DNA (Cologuard)  06/09/2015    There are no preventive care reminders to display for this patient.  Lab Results  Component Value Date   TSH 3.500 11/01/2017   Lab Results  Component Value Date   WBC 8.6 04/06/2018   HGB 13.7 04/06/2018   HCT 41.7 04/06/2018   MCV 95.6 04/06/2018   PLT 258 04/06/2018   Lab Results  Component Value Date   NA 137 04/06/2018   K 3.8 04/06/2018   CO2 25 04/06/2018   GLUCOSE 114 (H) 04/06/2018   BUN 8 04/06/2018   CREATININE 0.52 04/06/2018   BILITOT 0.2 11/01/2017   ALKPHOS 80 11/01/2017   AST 13 11/01/2017   ALT 13 11/01/2017   PROT 7.1 11/01/2017   ALBUMIN 4.5 11/01/2017   CALCIUM 8.7 (L) 04/06/2018   ANIONGAP 6 04/06/2018   No results found for: CHOL No results found for: HDL No results found for: LDLCALC No results found for: TRIG No results found for: CHOLHDL No results found for: RCVE9F    Assessment & Plan:  Mikisha was seen today  for neck pain.  Diagnoses and all orders for this visit:  Anxiety and depression  We discussed options for treatment of anxiety including therapy and/or medication.  Will check basic labs to ensure thyroid is in normal range and that no other metabolic issues are obvious.  Reviewed concept of anxiety as biochemical imbalance of neurotransmitters and rationale for treatment. Discussed potential risks, expected benefits, possible side effects of the medicine. We also discussed how  to take it correctly and dosing instructions. If she has any significant side effects to the medicine, she is to stop it and call for advice.  Instructed patient to contact office or on-call physician promptly should condition worsen or any new symptoms appear.    She was agreeable with this plan.   Spent 25 minutes (>50% of visit) discussing the risks of anxiety disorder, the pathophysiology, etiology, risks, and principles of treatment.   -     escitalopram (LEXAPRO) 10 MG tablet; Take 1 tablet (10 mg total) by mouth daily. -     busPIRone (BUSPAR) 5 MG tablet; Take 1 tablet (5 mg total) by mouth 2 (two) times daily.  Neck pain Stress and some stiffness stretching exercise trapezius tightnes  Elevated blood-pressure reading, without diagnosis of hypertension Counseled on blood pressure goal of less than 130/80. On follow up visit if remains elevated will add Started HCTZ 25mg  daily   Other orders -     diclofenac Sodium (VOLTAREN) 1 % GEL; Apply 4 g topically 4 (four) times daily. -     Follow-up: Return in about 6 weeks (around 10/10/2019) for 6 weeks tele follow up on medication for depression and anxiety .    [name], NP

## 2019-08-29 NOTE — Patient Instructions (Signed)
Trastorno de ansiedad generalizada, en adultos Generalized Anxiety Disorder, Adult El trastorno de ansiedad generalizada (TAG) es un trastorno de salud mental. Las personas con esta afeccin se preocupan constantemente por los eventos de todos los das. A diferencia de la ansiedad normal, la preocupacin relacionada con el TAG no se produce por un evento especfico. Estas preocupaciones tampoco desaparecen ni mejoran con el tiempo. EL TAG interfiere con las funciones de la vida, incluidas las relaciones, el trabajo y la escuela. El TAG puede variar de leve a grave. Las personas con TAG grave tienen intensas oleadas de ansiedad con sntomas fsicos (crisis de angustia). Cules son las causas? Se desconoce la causa exacta del TAG. Qu incrementa el riesgo? Es ms probable que esta afeccin se manifieste en:  Mujeres.  Las personas que tienen antecedentes familiares de trastornos de ansiedad.  Las personas que son muy tmidas.  Las personas que experimentan eventos muy estresantes en la vida, tales como la muerte de un ser querido.  Las personas que tienen un entorno familiar muy estresante. Cules son los signos o los sntomas? Con frecuencia, las personas que padecen el TAG se preocupan excesivamente por muchas cosas en la vida, tales como su salud y su familia. Tambin pueden experimentar una preocupacin desmedida por lo siguiente:  Hacer las cosas bien.  Llegar a tiempo.  Los desastres naturales.  Las amistades. Los sntomas fsicos del TAG incluyen:  Fatiga.  Tensin muscular o contracciones musculares.  Temblores o agitacin.  Sobresaltarse con facilidad.  Sentir que el corazn late fuerte o est acelerado.  Sentir falta de aire o como que no se puede respirar profundamente.  Problemas para quedarse dormido o para seguir durmiendo.  Sudoracin.  Nuseas, diarrea o sndrome del intestino irritable (SII).  Dolores de cabeza.  Dificultad para concentrarse o  recordar hechos.  Agitacin.  Irritabilidad. Cmo se diagnostica? El mdico puede diagnosticar el TAG en funcin de los sntomas y los antecedentes mdicos. Se le realizar un examen fsico. El mdico le har preguntas especficas sobre sus sntomas, que incluyen qu tan graves son, cundo comenzaron y si van y vienen. Tambin puede hacerle preguntas sobre el consumo de alcohol o drogas, incluidos los medicamentos recetados. Su mdico podr derivarlo a un especialista de salud mental para ms evaluaciones. Su mdico le realizar un examen exhaustivo y puede hacer pruebas adicionales para descartar otras posibles causas de sus sntomas. Para recibir un diagnstico del TAG, una persona debe tener ansiedad que:  Est fuera de su control.  Afecte distintos aspectos de su vida, como el trabajo y las relaciones.  Cause angustia que le impida participar en sus actividades habituales.  Incluya al menos tres sntomas fsicos del TAG tales como inquietud, fatiga, dificultad para concentrarse, irritabilidad, tensin muscular o problemas para dormir. Antes de que su mdico pueda confirmar un diagnstico del TAG, estos sntomas deben estar presentes ms das de los que no lo estn y deben tener una duracin de seis meses o ms. Cmo se trata? Las siguientes terapias se usan generalmente para tratar el TAG:  Medicamentos. Por lo general, los medicamentos antidepresivos se recetan para un control diario a largo plazo. Se pueden agregar medicamentos para la ansiedad en casos graves, especialmente cuando ocurren crisis de angustia.  Psicoterapia (psicoanlisis). Determinados tipos de psicoterapia pueden ser tiles para tratar el TAG al brindar apoyo, educacin y orientacin. Entre las opciones se incluyen las siguientes: ? Terapia cognitivo conductual (TCC). Las personas aprenden habilidades para sobrellevar situaciones y tcnicas para aliviar la ansiedad.   Aprenden a identificar conductas y pensamientos  irreales o negativos, y a reemplazarlos por positivos. ? Terapia de aceptacin y compromiso (acceptance and commitment therapy, ACT). Este tratamiento ensea a las personas a ser conscientes como una forma de lidiar con pensamientos y sentimientos no deseados. ? Biorretroalimentacin. Este proceso lo capacita para controlar la respuesta del cuerpo (respuesta psicolgica) a travs de tcnicas de respiracin y mtodos de relajacin. Usted trabajar con un terapeuta mientras se usan mquinas para controlar sus sntomas fsicos.  Tcnicas para controlar el estrs. Estas incluyen yoga, meditacin y ejercicio. Un especialista en salud mental puede ayudar a determinar qu tratamiento es el mejor para usted. Algunas personas pueden mejorar con solo un tipo de terapia. Sin embargo, otras personas requieren una combinacin de terapias. Siga estas instrucciones en su casa:  Tome los medicamentos de venta libre y los recetados solamente como se lo haya indicado el mdico.  Trate de mantener una rutina normal.  Trate de anticipar situaciones estresantes y permita un tiempo adicional para controlarlas.  Participe de cualquier tcnica para autocalmarse o controlar el estrs segn las indicaciones de su mdico.  No se castigue ante los retrocesos o por no realizar progresos.  Trate de reconocer sus logros aunque sean pequeos.  Concurra a todas las visitas de seguimiento como se lo haya indicado el mdico. Esto es importante. Comunquese con un mdico si:  Los sntomas no mejoran.  Los sntomas empeoran.  Tiene signos de depresin tales como: ? Tristeza, mal humor o irritabilidad. ? Ya no disfruta de actividades que le solan causar placer. ? Cambios en el peso y o en sus hbitos de alimentacin. ? Cambios en los hbitos de sueo. ? Evita a amigos y familiares. ? No tiene energa para realizar las tareas habituales. ? Tiene sentimientos de culpa o de minusvala. Solicite ayuda de inmediato  si:  Tiene pensamientos serios acerca de lastimarse a usted mismo o daar a otras personas. Si alguna vez siente que puede lastimarse o lastimar a los dems, o piensa en poner fin a su vida, busque ayuda de inmediato. Puede dirigirse al servicio de urgencias ms cercano o comunicarse con:  El servicio de emergencias de su localidad (911 en los [place]).  Una lnea de asistencia al suicida y atencin en crisis, como la Lnea Nacional de Prevencin del Suicidio (National Suicide Prevention Lifeline) al 1-800-273-8255. Est disponible las 24 horas del da. Resumen  El trastorno de ansiedad generalizada (TAG) es un trastorno de salud mental que implica preocupacin no causada por un evento especfico.  Con frecuencia, las personas que padecen el TAG se preocupan excesivamente por muchas cosas en la vida, tales como su salud y su familia.  El TAG puede causar sntomas fsicos tales como inquietud, dificultad para concentrarse, problemas para dormir, sudoracin frecuente, nuseas, diarrea, dolores de cabeza y temblores, o contracciones musculares.  Un especialista en salud mental puede ayudar a determinar qu tratamiento es el mejor para usted. Algunas personas pueden mejorar con solo un tipo de terapia. Sin embargo, otras personas requieren una combinacin de terapias. Esta informacin no tiene como fin reemplazar el consejo del mdico. Asegrese de hacerle al mdico cualquier pregunta que tenga. Document Revised: 11/13/2016 Document Reviewed: 11/13/2016 Elsevier Patient Education  2020 Elsevier Inc.  

## 2019-08-30 NOTE — BH Specialist Note (Signed)
Integrated Behavioral Health Initial Visit  MRN: [id number] Name: Arthur Aydelotte  Number of Integrated Behavioral Health Clinician visits:: 1/6 Session Start time: 11:30 AM  Session End time: 12:00 PM Total time: 30  Type of Service: Integrated Behavioral Health- Individual Interpretor:Yes.   Interpretor Name and Language: Victorio Palm #[id number] Spanish   Warm Hand Off Completed.       SUBJECTIVE: Bailyn Spackman is a 54 y.o. female accompanied by self Patient was referred by NP Randa Evens for depression and anxiety. Patient reports the following symptoms/concerns: Pt reports increase in anxiety and depression symptoms triggered by stress. Symptoms include worry regarding financial strain, headaches, racing thoughts, bouts of crying, decreased appetite, difficulty sleeping, and irritability.  Duration of problem: 3 weeks; Severity of problem: severe  OBJECTIVE: Mood: Anxious and Affect: Tearful Risk of harm to self or others: No plan to harm self or others  LIFE CONTEXT: Family and Social: Pt receives very limited support. She is the sole caregiver to 72 year old daughter School/Work: Pt works six days a week. She has medicaid Self-Care: Pt could not identify any coping skills. She is open to medication management Life Changes: Pt is having difficulty managing stress triggered by psychosocial stressors  GOALS ADDRESSED: Patient will: 1. Reduce symptoms of: agitation, anxiety, depression and stress 2. Increase knowledge and/or ability of: coping skills  3. Demonstrate ability to: Increase healthy adjustment to current life circumstances and Increase adequate support systems for patient/family  INTERVENTIONS: Interventions utilized: Mindfulness or Management consultant, Supportive Counseling and Psychoeducation and/or Health Education  Standardized Assessments completed: GAD-7 and PHQ 2&9  ASSESSMENT: Patient currently experiencing anxiety and depression symptoms  triggered by psychosocial stressors and very limited support. Symptoms have increased in the last three weeks, pt denies SI/HI.    Patient may benefit from medication management and brief therapy. LCSW discussed correlation between one's physical and mental health, in addition, to how stress can negatively impact both. Healthy coping skills to assist with management of stress were identified and patient is willing to participate in medication management through PCP.   PLAN: 1. Follow up with behavioral health clinician on : Follow up appointment scheduled with PCP in 4 weeks.  2. Behavioral recommendations: Utilize healthy coping skills discussed in session and comply with medication management 3. Referral(s): Integrated Behavioral Health Services (In Clinic) 4. "From scale of 1-10, how likely are you to follow plan?":   Bridgett Larsson, LCSW 08/30/2019 6:25 AM

## 2019-09-20 ENCOUNTER — Ambulatory Visit (INDEPENDENT_AMBULATORY_CARE_PROVIDER_SITE_OTHER): Payer: Medicaid Other | Admitting: Primary Care

## 2019-09-20 ENCOUNTER — Other Ambulatory Visit: Payer: Self-pay

## 2019-09-20 ENCOUNTER — Encounter (INDEPENDENT_AMBULATORY_CARE_PROVIDER_SITE_OTHER): Payer: Self-pay | Admitting: Primary Care

## 2019-09-20 VITALS — BP 153/84 | HR 77 | Temp 97.5°F | Ht 60.0 in | Wt 131.6 lb

## 2019-09-20 DIAGNOSIS — J029 Acute pharyngitis, unspecified: Secondary | ICD-10-CM

## 2019-09-20 DIAGNOSIS — I10 Essential (primary) hypertension: Secondary | ICD-10-CM | POA: Diagnosis not present

## 2019-09-20 DIAGNOSIS — F17200 Nicotine dependence, unspecified, uncomplicated: Secondary | ICD-10-CM | POA: Diagnosis not present

## 2019-09-20 MED ORDER — BLOOD PRESSURE MONITOR DEVI: 1.0000 | Freq: Two times a day (BID) | Status: AC | PRN

## 2019-09-20 MED ORDER — HYDROCHLOROTHIAZIDE 25 MG PO TABS: 25.0000 mg | ORAL_TABLET | Freq: Every day | ORAL | 1 refills | Status: DC

## 2019-09-20 NOTE — Progress Notes (Signed)
Acute Office Visit  Subjective:    Patient ID: Amber Crawford, female    DOB: June 03, 1966, 54 y.o.   MRN: [id number]  Chief Complaint  Patient presents with  . soreness    HPI Patient is in today for sore scratchy throat denies fever or chills . New diagnosis of hypertension last readings have been greater than 130/80. Denies shortness of breath, headaches, chest pain or lower extremity edema  Past Medical History:  Diagnosis Date  . Gastritis     Past Surgical History:  Procedure Laterality Date  . CESAREAN SECTION    . CHOLECYSTECTOMY      History reviewed. No pertinent family history.  Social History   Socioeconomic History  . Marital status: Single    Spouse name: Not on file  . Number of children: Not on file  . Years of education: Not on file  . Highest education level: Not on file  Occupational History  . Not on file  Tobacco Use  . Smoking status: Current Some Day Smoker    Packs/day: 0.50    Types: Cigarettes  . Smokeless tobacco: Never Used  Substance and Sexual Activity  . Alcohol use: No  . Drug use: No  . Sexual activity: Not on file  Other Topics Concern  . Not on file  Social History Narrative  . Not on file   Social Determinants of Health   Financial Resource Strain:   . Difficulty of Paying Living Expenses: Not on file  Food Insecurity:   . Worried About Charity fundraiser in the Last Year: Not on file  . Ran Out of Food in the Last Year: Not on file  Transportation Needs:   . Lack of Transportation (Medical): Not on file  . Lack of Transportation (Non-Medical): Not on file  Physical Activity:   . Days of Exercise per Week: Not on file  . Minutes of Exercise per Session: Not on file  Stress:   . Feeling of Stress : Not on file  Social Connections:   . Frequency of Communication with Friends and Family: Not on file  . Frequency of Social Gatherings with Friends and Family: Not on file  . Attends Religious Services: Not  on file  . Active Member of Clubs or Organizations: Not on file  . Attends Archivist Meetings: Not on file  . Marital Status: Not on file  Intimate Partner Violence:   . Fear of Current or Ex-Partner: Not on file  . Emotionally Abused: Not on file  . Physically Abused: Not on file  . Sexually Abused: Not on file    Outpatient Medications Prior to Visit  Medication Sig Dispense Refill  . busPIRone (BUSPAR) 5 MG tablet Take 1 tablet (5 mg total) by mouth 2 (two) times daily. 180 tablet 1  . diclofenac Sodium (VOLTAREN) 1 % GEL Apply 4 g topically 4 (four) times daily. 350 g 1  . escitalopram (LEXAPRO) 10 MG tablet Take 1 tablet (10 mg total) by mouth daily. 90 tablet 1  . dextromethorphan-guaiFENesin (MUCINEX DM) 30-600 MG 12hr tablet Take 1 tablet by mouth 2 (two) times daily. (Patient not taking: Reported on 08/29/2019) 30 tablet 1  . hydrochlorothiazide (HYDRODIURIL) 25 MG tablet Take 1 tablet (25 mg total) by mouth daily. (Patient not taking: Reported on 09/20/2019) 90 tablet 1  . loratadine (CLARITIN) 10 MG tablet Take 1 tablet (10 mg total) by mouth daily. (Patient not taking: Reported on 08/29/2019) 30 tablet 11  .  meclizine (ANTIVERT) 25 MG tablet Take 1 tablet (25 mg total) by mouth 2 (two) times daily as needed for dizziness. (Patient not taking: Reported on 08/29/2019) 30 tablet 0  . tamsulosin (FLOMAX) 0.4 MG CAPS capsule Take 1 capsule (0.4 mg total) by mouth daily. (Patient not taking: Reported on 03/09/2019) 30 capsule 0   No facility-administered medications prior to visit.    Allergies  Allergen Reactions  . Aspirin Other (See Comments)    Fingers get numb; OK to take acetaminophen and IBU    Review of Systems  HENT: Positive for sore throat.   All other systems reviewed and are negative.      Objective:    Physical Exam Vitals reviewed.  Constitutional:      Appearance: Normal appearance.  HENT:     Head:     Comments: No exudate on tonsils or oral  lesions  Cardiovascular:     Rate and Rhythm: Normal rate and regular rhythm.  Pulmonary:     Effort: Pulmonary effort is normal.     Breath sounds: Normal breath sounds.  Abdominal:     General: Bowel sounds are normal.     Palpations: Abdomen is soft.  Musculoskeletal:        General: Normal range of motion.  Skin:    General: Skin is warm and dry.  Neurological:     Mental Status: She is alert and oriented to person, place, and time.  Psychiatric:        Mood and Affect: Mood normal.        Thought Content: Thought content normal.        Judgment: Judgment normal.     BP (!) 153/84 (BP Location: Left Arm, Patient Position: Sitting, Cuff Size: Normal)   Pulse 77   Temp (!) 97.5 F (36.4 C) (Temporal)   Ht 5' (1.524 m)   Wt 131 lb 9.6 oz (59.7 kg)   LMP 08/25/2019 (Approximate)   SpO2 97%   BMI 25.70 kg/m  Wt Readings from Last 3 Encounters:  09/20/19 131 lb 9.6 oz (59.7 kg)  08/29/19 131 lb (59.4 kg)  04/06/18 135 lb (61.2 kg)    Health Maintenance Due  Topic Date Due  . PAP SMEAR-Modifier  06/08/1986  . MAMMOGRAM  06/09/2015  . Fecal DNA (Cologuard)  06/09/2015    There are no preventive care reminders to display for this patient.   Lab Results  Component Value Date   TSH 3.500 11/01/2017   Lab Results  Component Value Date   WBC 8.6 04/06/2018   HGB 13.7 04/06/2018   HCT 41.7 04/06/2018   MCV 95.6 04/06/2018   PLT 258 04/06/2018   Lab Results  Component Value Date   NA 137 04/06/2018   K 3.8 04/06/2018   CO2 25 04/06/2018   GLUCOSE 114 (H) 04/06/2018   BUN 8 04/06/2018   CREATININE 0.52 04/06/2018   BILITOT 0.2 11/01/2017   ALKPHOS 80 11/01/2017   AST 13 11/01/2017   ALT 13 11/01/2017   PROT 7.1 11/01/2017   ALBUMIN 4.5 11/01/2017   CALCIUM 8.7 (L) 04/06/2018   ANIONGAP 6 04/06/2018   No results found for: CHOL No results found for: HDL No results found for: LDLCALC No results found for: TRIG No results found for: CHOLHDL No  results found for: HGBA1C     Assessment & Plan:  Amber Crawford was seen today for soreness.  Diagnoses and all orders for this visit:  Essential hypertension Counseled on blood  pressure goal of less than 130/80, not at goal today 153/84 encourage to take HCTZ 41m daily a low-sodium, DASH diet, medication compliance, 150 minutes of moderate intensity exercise per week.  Sore throat No erythremia excudate no swollen nodes treat symptoms pain tylenol , gargle with salt water or astringic mouthwash   Other orders -     Blood Pressure Monitor DEVI; 1 kit by Does not apply route 2 (two) times daily as needed.    Meds ordered this encounter  Medications  . Blood Pressure Monitor DEVI    Sig: 1 kit by Does not apply route 2 (two) times daily as needed.    Dispense:  1 each    Refill:  bag     MKerin Perna NP

## 2019-09-20 NOTE — Patient Instructions (Signed)
Hipertensión en los adultos °Hypertension, Adult °El término hipertensión es otra forma de denominar a la presión arterial elevada. La presión arterial elevada fuerza al corazón a trabajar más para bombear la sangre. Esto puede causar problemas con el paso del tiempo. °Una lectura de presión arterial está compuesta por 2 números. Hay un número superior (sistólico) sobre un número inferior (diastólico). Lo ideal es tener la presión arterial por debajo de 120/80. Las elecciones saludables pueden ayudar a bajar la presión arterial, o tal vez necesite medicamentos para bajarla. °¿Cuáles son las causas? °Se desconoce la causa de esta afección. Algunas afecciones pueden estar relacionadas con la presión arterial alta. °¿Qué incrementa el riesgo? °· Fumar. °· Tener diabetes mellitus tipo 2, colesterol alto, o ambos. °· No hacer la cantidad suficiente de actividad física o ejercicio. °· Tener sobrepeso. °· Consumir mucha grasa, azúcar, calorías o sal (sodio) en su dieta. °· Beber alcohol en exceso. °· Tener una enfermedad renal a largo plazo (crónica). °· Tener antecedentes familiares de presión arterial alta. °· Edad. Los riesgos aumentan con la edad. °· Raza. El riesgo es mayor para las personas afroamericanas. °· Sexo. Antes de los 45 años, los hombres corren más riesgo que las mujeres. Después de los 65 años, las mujeres corren más riesgo que los hombres. °· Tener apnea obstructiva del sueño. °· Estrés. °¿Cuáles son los signos o los síntomas? °· Es posible que la presión arterial alta puede no cause síntomas. La presión arterial muy alta (crisis hipertensiva) puede provocar: °? Dolor de cabeza. °? Sensaciones de preocupación o nerviosismo (ansiedad). °? Falta de aire. °? Hemorragia nasal. °? Sensación de malestar en el estómago (náuseas). °? Vómitos. °? Cambios en la forma de ver. °? Dolor muy intenso en el pecho. °? Convulsiones. °¿Cómo se trata? °· Esta afección se trata haciendo cambios saludables en el estilo de  vida, por ejemplo: °? Consumir alimentos saludables. °? Hacer más ejercicio. °? Beber menos alcohol. °· El médico puede recetarle medicamentos si los cambios en el estilo de vida no son suficientes para lograr controlar la presión arterial y si: °? El número de arriba está por encima de 130. °? El número de abajo está por encima de 80. °· Su presión arterial personal ideal puede variar. °Siga estas instrucciones en su casa: °Comida y bebida ° °· Si se lo dicen, siga el plan de alimentación de DASH (Dietary Approaches to Stop Hypertension, Maneras de alimentarse para detener la hipertensión). Para seguir este plan: °? Llene la mitad del plato de cada comida con frutas y verduras. °? Llene un cuarto del plato de cada comida con cereales integrales. Los cereales integrales incluyen pasta integral, arroz integral y pan integral. °? Coma y beba productos lácteos con bajo contenido de grasa, como leche descremada o yogur bajo en grasas. °? Llene un cuarto del plato de cada comida con proteínas bajas en grasa (magras). Las proteínas bajas en grasa incluyen pescado, pollo sin piel, huevos, frijoles y tofu. °? Evite consumir carne grasa, carne curada y procesada, o pollo con piel. °? Evite consumir alimentos prehechos o procesados. °· Consuma menos de 1500 mg de sal por día. °· No beba alcohol si: °? El médico le indica que no lo haga. °? Está embarazada, puede estar embarazada o está tratando de quedar embarazada. °· Si bebe alcohol: °? Limite la cantidad que bebe a lo siguiente: °§ De 0 a 1 medida por día para las mujeres. °§ De 0 a 2 medidas por día para los hombres. °? Esté atento a   la cantidad de alcohol que hay en las bebidas que toma. En los [place], una medida equivale a una botella de cerveza de 12 oz (355 ml), un vaso de vino de 5 oz (148 ml) o un vaso de una bebida alcohólica de alta graduación de 1½ oz (44 ml). °Estilo de vida ° °· Trabaje con su médico para mantenerse en un peso saludable o para perder  peso. Pregúntele a su médico cuál es el peso recomendable para usted. °· Haga al menos 30 minutos de ejercicio la mayoría de los días de la semana. Estos pueden incluir caminar, nadar o andar en bicicleta. °· Realice al menos 30 minutos de ejercicio que fortalezca sus músculos (ejercicios de resistencia) al menos 3 días a la semana. Estos pueden incluir levantar pesas o hacer Pilates. °· No consuma ningún producto que contenga nicotina o tabaco, como cigarrillos, cigarrillos electrónicos y tabaco de mascar. Si necesita ayuda para dejar de fumar, consulte al médico. °· Controle su presión arterial en su casa tal como le indicó el médico. °· Concurra a todas las visitas de seguimiento como se lo haya indicado el médico. Esto es importante. °Medicamentos °· Tome los medicamentos de venta libre y los recetados solamente como se lo haya indicado el médico. Siga cuidadosamente las indicaciones. °· No omita las dosis de medicamentos para la presión arterial. Los medicamentos pierden eficacia si omite dosis. El hecho de omitir las dosis también aumenta el riesgo de otros problemas. °· Pregúntele a su médico a qué efectos secundarios o reacciones a los medicamentos debe prestar atención. °Comuníquese con un médico si: °· Piensa que tiene una reacción a los medicamentos que está tomando. °· Tiene dolores de cabeza frecuentes (recurrentes). °· Se siente mareado. °· Tiene hinchazón en los tobillos. °· Tiene problemas de visión. °Solicite ayuda inmediatamente si: °· Siente un dolor de cabeza muy intenso. °· Empieza a sentirse desorientado (confundido). °· Se siente débil o adormecido. °· Siente que va a desmayarse. °· Tiene un dolor muy intenso en las siguientes zonas: °? Pecho. °? Vientre (abdomen). °· Vomita más de una vez. °· Tiene dificultad para respirar. °Resumen °· El término hipertensión es otra forma de denominar a la presión arterial elevada. °· La presión arterial elevada fuerza al corazón a trabajar más para bombear  la sangre. °· Para la mayoría de las personas, una presión arterial normal es menor que 120/80. °· Las decisiones saludables pueden ayudarle a disminuir su presión arterial. Si no puede bajar su presión arterial mediante decisiones saludables, es posible que deba tomar medicamentos. °Esta información no tiene como fin reemplazar el consejo del médico. Asegúrese de hacerle al médico cualquier pregunta que tenga. °Document Revised: 05/26/2018 Document Reviewed: 05/26/2018 °Elsevier Patient Education © 2020 Elsevier Inc. ° °

## 2019-09-20 NOTE — Progress Notes (Signed)
Pt was feeling heaviness and pressure in her throat about a week ago. She has since been drinking tea and states she does not feel the heaviness/pressure or soreness like she used

## 2019-09-21 ENCOUNTER — Telehealth (INDEPENDENT_AMBULATORY_CARE_PROVIDER_SITE_OTHER): Payer: Self-pay

## 2019-09-21 NOTE — Telephone Encounter (Signed)
Open in error

## 2019-09-26 ENCOUNTER — Ambulatory Visit (INDEPENDENT_AMBULATORY_CARE_PROVIDER_SITE_OTHER): Payer: Medicaid Other | Admitting: Primary Care

## 2019-10-02 ENCOUNTER — Telehealth (INDEPENDENT_AMBULATORY_CARE_PROVIDER_SITE_OTHER): Payer: Self-pay

## 2019-10-02 NOTE — Telephone Encounter (Signed)
Patient called to inform that she is starting to have a minor throbbing sensation. Patient states that when she is also sitting down she also feels a tingling sensation. Patient states she also feels a bit dizzy when turning fast. Patient does not know if this has to do with her stopping the intake of caffeine. Patient would like a call back from her PCP or to be advice if she needs to be seen.  Please advice [phone]   Patient needs an interpreter

## 2019-10-02 NOTE — Telephone Encounter (Signed)
Patient schedule an appointment for Feb 10 @ 3:50 pm.

## 2019-10-02 NOTE — Telephone Encounter (Signed)
Patient needs appt. Please schedule

## 2019-10-04 ENCOUNTER — Other Ambulatory Visit: Payer: Self-pay

## 2019-10-04 ENCOUNTER — Ambulatory Visit (INDEPENDENT_AMBULATORY_CARE_PROVIDER_SITE_OTHER): Payer: Medicaid Other | Admitting: Primary Care

## 2019-10-04 DIAGNOSIS — Z13220 Encounter for screening for lipoid disorders: Secondary | ICD-10-CM

## 2019-10-04 DIAGNOSIS — Z1231 Encounter for screening mammogram for malignant neoplasm of breast: Secondary | ICD-10-CM

## 2019-10-04 DIAGNOSIS — I10 Essential (primary) hypertension: Secondary | ICD-10-CM | POA: Diagnosis not present

## 2019-10-04 NOTE — Progress Notes (Signed)
Virtual Visit via Telephone Note  I connected with Amber Crawford on 10/04/19 at  3:50 PM EST by telephone and verified that I am speaking with the correct person using two identifiers.   I discussed the limitations, risks, security and privacy concerns of performing an evaluation and management service by telephone and the availability of in person appointments. I also discussed with the patient that there may be a patient responsible charge related to this service. The patient expressed understanding and agreed to proceed.   History of Present Illness: [name] is having a tele visit for effectiveness of HCTZ 19m of blood pressure.  Patient has been taking bp daily but not taking the medication .  Bp has range from systolic 1372-902and diastolic 811-155 She denies shortness of breath, chest pain or lower extremity edema but she does have headaches and takes tylenol 500 mg (1-2) or Advil (1-2) and her headache resolve. States she feels better since she started the Buspar for anxiety and lexapro for depression no change in dosage is required.   Past Medical History:  Diagnosis Date  . Gastritis   Hypertension Anxiety  Depression Current Outpatient Medications on File Prior to Visit  Medication Sig Dispense Refill  . Blood Pressure Monitor DEVI 1 kit by Does not apply route 2 (two) times daily as needed. 1 each bag  . busPIRone (BUSPAR) 5 MG tablet Take 1 tablet (5 mg total) by mouth 2 (two) times daily. 180 tablet 1  . diclofenac Sodium (VOLTAREN) 1 % GEL Apply 4 g topically 4 (four) times daily. 350 g 1  . escitalopram (LEXAPRO) 10 MG tablet Take 1 tablet (10 mg total) by mouth daily. 90 tablet 1  . loratadine (CLARITIN) 10 MG tablet Take 1 tablet (10 mg total) by mouth daily. 30 tablet 11  . hydrochlorothiazide (HYDRODIURIL) 25 MG tablet Take 1 tablet (25 mg total) by mouth daily. (Patient not taking: Reported on 09/20/2019) 90 tablet 1   No current  facility-administered medications on file prior to visit.   Observations/Objective: NHeleenawas seen today for sinusitis and tingling.  Diagnoses and all orders for this visit:  Essential hypertension There was a misunderstanding patient was taking blood pressure daily but did not take her blood pressure medication HCTZ 29m She is now aware of taking Bp in the morning than 30-1hr check her blood pressure. Explained Bp would be considered controlled if </= 130/80. She voices understanding. Also, placed future blood work orders . -     CBC with Differential/Platelet; Future -     CMP14+EGFR; Future  Lipid screening Health maintenance    -     Lipid panel; Future  Encounter for screening mammogram for malignant neoplasm of breast Breast cancer screening is recommended yearly at the age of [age]Circumstance may require earlier - family history   Referred for MM Digital Diagnostic Bilat    Assessment and Plan: Schedule pap   Follow Up Instructions:    I discussed the assessment and treatment plan with the patient. The patient was provided an opportunity to ask questions and all were answered. The patient agreed with the plan and demonstrated an understanding of the instructions.   The patient was advised to call back or seek an in-person evaluation if the symptoms worsen or if the condition fails to improve as anticipated.  I provided 10  minutes of non-face-to-face time during this encounter.   MiKerin PernaNP

## 2019-10-04 NOTE — Progress Notes (Signed)
Bp reading this am 132/100 HR 75

## 2019-10-06 ENCOUNTER — Other Ambulatory Visit (INDEPENDENT_AMBULATORY_CARE_PROVIDER_SITE_OTHER): Payer: Medicaid Other

## 2019-10-06 ENCOUNTER — Other Ambulatory Visit: Payer: Self-pay

## 2019-10-06 DIAGNOSIS — Z13220 Encounter for screening for lipoid disorders: Secondary | ICD-10-CM

## 2019-10-06 DIAGNOSIS — I10 Essential (primary) hypertension: Secondary | ICD-10-CM

## 2019-10-07 LAB — CBC WITH DIFFERENTIAL/PLATELET
Basophils Absolute: 0.1 10*3/uL (ref 0.0–0.2)
Basos: 1 %
EOS (ABSOLUTE): 0.3 10*3/uL (ref 0.0–0.4)
Eos: 4 %
Hematocrit: 44.3 % (ref 34.0–46.6)
Hemoglobin: 15.3 g/dL (ref 11.1–15.9)
Immature Grans (Abs): 0 10*3/uL (ref 0.0–0.1)
Immature Granulocytes: 0 %
Lymphocytes Absolute: 2.9 10*3/uL (ref 0.7–3.1)
Lymphs: 36 %
MCH: 32 pg (ref 26.6–33.0)
MCHC: 34.5 g/dL (ref 31.5–35.7)
MCV: 93 fL (ref 79–97)
Monocytes Absolute: 0.7 10*3/uL (ref 0.1–0.9)
Monocytes: 9 %
Neutrophils Absolute: 4 10*3/uL (ref 1.4–7.0)
Neutrophils: 50 %
Platelets: 293 10*3/uL (ref 150–450)
RBC: 4.78 x10E6/uL (ref 3.77–5.28)
RDW: 12.9 % (ref 11.7–15.4)
WBC: 8 10*3/uL (ref 3.4–10.8)

## 2019-10-07 LAB — CMP14+EGFR
ALT: 8 IU/L (ref 0–32)
AST: 13 IU/L (ref 0–40)
Albumin/Globulin Ratio: 1.7 (ref 1.2–2.2)
Albumin: 4.4 g/dL (ref 3.8–4.9)
Alkaline Phosphatase: 89 IU/L (ref 39–117)
BUN/Creatinine Ratio: 22 (ref 9–23)
BUN: 14 mg/dL (ref 6–24)
Bilirubin Total: 0.4 mg/dL (ref 0.0–1.2)
CO2: 21 mmol/L (ref 20–29)
Calcium: 9.4 mg/dL (ref 8.7–10.2)
Chloride: 99 mmol/L (ref 96–106)
Creatinine, Ser: 0.63 mg/dL (ref 0.57–1.00)
GFR calc Af Amer: 118 mL/min/{1.73_m2} (ref >59)
GFR calc non Af Amer: 102 mL/min/{1.73_m2} (ref >59)
Globulin, Total: 2.6 g/dL (ref 1.5–4.5)
Glucose: 90 mg/dL (ref 65–99)
Potassium: 4.5 mmol/L (ref 3.5–5.2)
Sodium: 138 mmol/L (ref 134–144)
Total Protein: 7 g/dL (ref 6.0–8.5)

## 2019-10-07 LAB — LIPID PANEL
Chol/HDL Ratio: 4.6 ratio — ABNORMAL HIGH (ref 0.0–4.4)
Cholesterol, Total: 299 mg/dL — ABNORMAL HIGH (ref 100–199)
HDL: 65 mg/dL (ref >39)
LDL Chol Calc (NIH): 202 mg/dL — ABNORMAL HIGH (ref 0–99)
Triglycerides: 171 mg/dL — ABNORMAL HIGH (ref 0–149)
VLDL Cholesterol Cal: 32 mg/dL (ref 5–40)

## 2019-10-09 ENCOUNTER — Other Ambulatory Visit (INDEPENDENT_AMBULATORY_CARE_PROVIDER_SITE_OTHER): Payer: Self-pay | Admitting: Primary Care

## 2019-10-09 MED ORDER — ATORVASTATIN CALCIUM 20 MG PO TABS: 20.0000 mg | ORAL_TABLET | Freq: Every day | ORAL | 3 refills | Status: DC

## 2019-10-13 ENCOUNTER — Telehealth (INDEPENDENT_AMBULATORY_CARE_PROVIDER_SITE_OTHER): Payer: Self-pay

## 2019-10-13 NOTE — Telephone Encounter (Signed)
Call placed using pacific interpreter [phone]) patient verified date of birth. She is aware that labs were normal other than elevated cholesterol. Patient educated that elevated cholesterol can lead to stroke and heart attack. Advised her to decrease fatty food, red meat, cheese, milk and eat more whole grains and vegetables. Atorvastatin has been sent to pharmacy to help lower cholesterol as well. Patient instructed to take every night at bedtime. She verbalized understanding of results. Maryjean Morn, CMA

## 2019-10-13 NOTE — Telephone Encounter (Signed)
-----   Message from Grayce Sessions, NP sent at 10/09/2019  1:48 PM EST ----- Elevated cholesterol that can lead to heart attack and stroke. Decrease your fatty foods, red meat, cheese, milk and increase fiber like whole grains and veggies.  Sent in atorvastatin 20mg  take at bedtime

## 2019-10-24 ENCOUNTER — Ambulatory Visit (INDEPENDENT_AMBULATORY_CARE_PROVIDER_SITE_OTHER): Payer: Medicaid Other | Admitting: Primary Care

## 2019-10-24 ENCOUNTER — Other Ambulatory Visit: Payer: Self-pay

## 2019-10-24 ENCOUNTER — Encounter (INDEPENDENT_AMBULATORY_CARE_PROVIDER_SITE_OTHER): Payer: Self-pay | Admitting: Primary Care

## 2019-10-24 DIAGNOSIS — E782 Mixed hyperlipidemia: Secondary | ICD-10-CM

## 2019-10-24 DIAGNOSIS — I10 Essential (primary) hypertension: Secondary | ICD-10-CM | POA: Diagnosis not present

## 2019-10-24 DIAGNOSIS — Z713 Dietary counseling and surveillance: Secondary | ICD-10-CM | POA: Diagnosis not present

## 2019-10-24 NOTE — Progress Notes (Signed)
Virtual Visit via Telephone Note  I connected with Amber Crawford on 10/24/19 at  3:50 PM EST by telephone and verified that I am speaking with the correct person using two identifiers.   I discussed the limitations, risks, security and privacy concerns of performing an evaluation and management service by telephone and the availability of in person appointments. I also discussed with the patient that there may be a patient responsible charge related to this service. The patient expressed understanding and agreed to proceed. Spanish interpretor used [place] ID [id number]  History of Present Illness: Amber Crawford is having a tele visit she has been dizzy for the last 4 day. She has stop eating because she not she what has too much cholesterol so she's been eating salads, made lentil soup and ate it she felt better. Scared to eat meat so she stopped.  Current Outpatient Medications on File Prior to Visit  Medication Sig Dispense Refill  . atorvastatin (LIPITOR) 20 MG tablet Take 1 tablet (20 mg total) by mouth daily. 90 tablet 3  . Blood Pressure Monitor DEVI 1 kit by Does not apply route 2 (two) times daily as needed. 1 each bag  . hydrochlorothiazide (HYDRODIURIL) 25 MG tablet Take 1 tablet (25 mg total) by mouth daily. 90 tablet 1  . busPIRone (BUSPAR) 5 MG tablet Take 1 tablet (5 mg total) by mouth 2 (two) times daily. (Patient not taking: Reported on 10/24/2019) 180 tablet 1  . diclofenac Sodium (VOLTAREN) 1 % GEL Apply 4 g topically 4 (four) times daily. 350 g 1  . escitalopram (LEXAPRO) 10 MG tablet Take 1 tablet (10 mg total) by mouth daily. (Patient not taking: Reported on 10/24/2019) 90 tablet 1  . loratadine (CLARITIN) 10 MG tablet Take 1 tablet (10 mg total) by mouth daily. 30 tablet 11   No current facility-administered medications on file prior to visit.   Observations/Objective: Review of Systems  Neurological: Positive for headaches.       Not eating  All other  systems reviewed and are negative.   Assessment and Plan: Amber Crawford was seen today for dizziness.  Diagnoses and all orders for this visit:  Essential hypertension Blood pressure today is good unable to provide ranges. Continue HCTZ 6m daily  Blood work on follow up  Mixed hyperlipidemia  On atorvastatin and became worried unsure what to eat so she initially stop eating every thing . Continue atorvastatin  274mat night and provided dietary counseling  Dietary counseling Discussed in detail what to eat and what to decrease  fatty foods, red meat, cheese, milk-change to 1-2% , iners (organ meats) and increase fiber like whole grains and veggies.Chicken, [place] fish are acceptable to eat baked or grilled.   Follow Up Instructions:    I discussed the assessment and treatment plan with the patient. The patient was provided an opportunity to ask questions and all were answered. The patient agreed with the plan and demonstrated an understanding of the instructions.   The patient was advised to call back or seek an in-person evaluation if the symptoms worsen or if the condition fails to improve as anticipated.  I provided 12 minutes of non-face-to-face time during this encounter.   MiKerin PernaNP

## 2019-10-24 NOTE — Progress Notes (Signed)
Pt reports dizziness for the last 4 days  Bp today 128/80 HR 70

## 2019-11-01 ENCOUNTER — Encounter (INDEPENDENT_AMBULATORY_CARE_PROVIDER_SITE_OTHER): Payer: Self-pay | Admitting: Primary Care

## 2019-11-01 ENCOUNTER — Other Ambulatory Visit: Payer: Self-pay

## 2019-11-01 ENCOUNTER — Ambulatory Visit (INDEPENDENT_AMBULATORY_CARE_PROVIDER_SITE_OTHER): Payer: Medicaid Other | Admitting: Primary Care

## 2019-11-01 DIAGNOSIS — Z0130 Encounter for examination of blood pressure without abnormal findings: Secondary | ICD-10-CM | POA: Diagnosis not present

## 2019-11-01 MED ORDER — HYDROCHLOROTHIAZIDE 25 MG PO TABS: 25.0000 mg | ORAL_TABLET | Freq: Every day | ORAL | 1 refills | Status: DC

## 2019-11-01 NOTE — Progress Notes (Signed)
Virtual Visit via Telephone Note  I connected with Amber Crawford on 11/01/19 at  9:50 AM EST by telephone and verified that I am speaking with the correct person using two identifiers.   I discussed the limitations, risks, security and privacy concerns of performing an evaluation and management service by telephone and the availability of in person appointments. I also discussed with the patient that there may be a patient responsible charge related to this service. The patient expressed understanding and agreed to proceed.   History of Present Illness: Amber Crawford is having a tele visit for blood pressure follow up . She was rushing this morning having to take her daughter to school and go to work. Therefore, she did not take her Bp medications or check her blood pressure. She did tell me she took her COVID vaccine 10/30/2019 and feels fine.   Past Medical History:  Diagnosis Date  . Gastritis    Current Outpatient Medications on File Prior to Visit  Medication Sig Dispense Refill  . atorvastatin (LIPITOR) 20 MG tablet Take 1 tablet (20 mg total) by mouth daily. 90 tablet 3  . Blood Pressure Monitor DEVI 1 kit by Does not apply route 2 (two) times daily as needed. 1 each bag  . busPIRone (BUSPAR) 5 MG tablet Take 1 tablet (5 mg total) by mouth 2 (two) times daily. 180 tablet 1  . diclofenac Sodium (VOLTAREN) 1 % GEL Apply 4 g topically 4 (four) times daily. 350 g 1  . escitalopram (LEXAPRO) 10 MG tablet Take 1 tablet (10 mg total) by mouth daily. 90 tablet 1  . loratadine (CLARITIN) 10 MG tablet Take 1 tablet (10 mg total) by mouth daily. (Patient not taking: Reported on 11/01/2019) 30 tablet 11   No current facility-administered medications on file prior to visit.   Observations/Objective: Review of Systems  All other systems reviewed and are negative.  Assessment and Plan: Shabnam was seen today for blood pressure check.  Diagnoses and all orders for this  visit:  Blood pressure check Check blood pressure daily at the same time. Keep a log of all blood pressure readings.  DASH DIET; No salt or low sodium diet  If pressure if greater than 150/100 notify me here at the office.  If it's persistently greater 180/90, go to the Emergency Department.  Take all medication as prescribed.   Avoid smoked meats which are high in sodium content.  Avoid soda which contains sodium and are high in sugar which increases your risk for diabetes.  Other orders -     hydrochlorothiazide (HYDRODIURIL) 25 MG tablet; Take 1 tablet (25 mg total) by mouth daily.    Follow Up Instructions:    I discussed the assessment and treatment plan with the patient. The patient was provided an opportunity to ask questions and all were answered. The patient agreed with the plan and demonstrated an understanding of the instructions.   The patient was advised to call back or seek an in-person evaluation if the symptoms worsen or if the condition fails to improve as anticipated.  I provided 9 minutes of non-face-to-face time during this encounter.   [name], NP

## 2019-11-01 NOTE — Progress Notes (Signed)
Pt is unable to check Bp. She is at work  Bp yesterday was 129/70

## 2020-01-23 ENCOUNTER — Other Ambulatory Visit: Payer: Self-pay

## 2020-01-23 ENCOUNTER — Encounter (INDEPENDENT_AMBULATORY_CARE_PROVIDER_SITE_OTHER): Payer: Self-pay | Admitting: Primary Care

## 2020-01-23 ENCOUNTER — Telehealth (INDEPENDENT_AMBULATORY_CARE_PROVIDER_SITE_OTHER): Payer: Self-pay

## 2020-01-23 ENCOUNTER — Telehealth (INDEPENDENT_AMBULATORY_CARE_PROVIDER_SITE_OTHER): Payer: Medicaid Other | Admitting: Primary Care

## 2020-01-23 DIAGNOSIS — I10 Essential (primary) hypertension: Secondary | ICD-10-CM

## 2020-01-23 DIAGNOSIS — F419 Anxiety disorder, unspecified: Secondary | ICD-10-CM

## 2020-01-23 DIAGNOSIS — F329 Major depressive disorder, single episode, unspecified: Secondary | ICD-10-CM

## 2020-01-23 DIAGNOSIS — J302 Other seasonal allergic rhinitis: Secondary | ICD-10-CM

## 2020-01-23 DIAGNOSIS — M25531 Pain in right wrist: Secondary | ICD-10-CM

## 2020-01-23 MED ORDER — ESCITALOPRAM OXALATE 10 MG PO TABS: 10.0000 mg | ORAL_TABLET | Freq: Every day | ORAL | 1 refills | Status: DC

## 2020-01-23 MED ORDER — LORATADINE 10 MG PO TABS: 10.0000 mg | ORAL_TABLET | Freq: Every day | ORAL | 11 refills | Status: DC

## 2020-01-23 MED ORDER — HYDROCHLOROTHIAZIDE 25 MG PO TABS: 25.0000 mg | ORAL_TABLET | Freq: Every day | ORAL | 1 refills | Status: DC

## 2020-01-23 MED ORDER — DICLOFENAC SODIUM 1 % EX GEL: 4.0000 g | Freq: Four times a day (QID) | CUTANEOUS | 1 refills | Status: DC

## 2020-01-23 NOTE — Progress Notes (Signed)
Pt complains of pain in right wrist when lifting something heavy or touching an item that's too hot or cold  Fingers swell  Pain increases when she has worked a lot voltaren gel is helping  Pt complains of allergies- would like to know what she can take. Is out of claritin

## 2020-01-23 NOTE — Progress Notes (Addendum)
Telephone Note  I connected with Amber Crawford on 01/23/20 at  3:50 PM EDT by telephone and verified that I am speaking with the correct person using two identifiers.   I discussed the limitations, risks, security and privacy concerns of performing an evaluation and management service by telephone and the availability of in person appointments. I also discussed with the patient that there may be a patient responsible charge related to this service. The patient expressed understanding and agreed to proceed. Patient location home [name] in the office at [facility]    History of Present Illness: Amber Crawford is a 54 year old female Hispanic female . Interpretor Dayanara # [id number] She is  Complaining of pain in right wrist when lifting something heavy or touching an item that's too hot or cold and states her fingers are swelling. Pain in her hands increase when working a lot. Repetitive motions probable underlying cause. States she has to work what else can she do has a family to provide for.  Also, complains of allergies- would like to know what she can have a refill on Claritin   Observations/Objective: Review of Systems  Musculoskeletal: Positive for myalgias.       Wrist pain works as a Programmer, applications - repetitive   All other systems reviewed and are negative.  Assessment and Plan: Jamieson was seen today for wrist pain.  Diagnoses and all orders for this visit:  Right wrist pain Continue to use Voltaren gel as needed for pain. Refer to orth to evaluate and treat.  Anxiety and depression Manage depression with Lexapro -     escitalopram (LEXAPRO) 10 MG tablet; Take 1 tablet (10 mg total) by mouth daily.  Essential hypertension Counseled on blood pressure goal of less than 130/80, low-sodium, DASH diet, medication compliance, 150 minutes of moderate intensity exercise per week. Bp yesterday 134/68. Continue HCTZ 25mg .    Seasonal  allergies It appears that you are struggling with allergies.  Fortunately, this is a common condition and can usually be managed with medication. Season changing increase in pollen  Things to do: Take the medications prescribed daily as directed.   Try not to skip days. You can use nasal spray use as directed. -     loratadine (CLARITIN) 10 MG tablet; Take 1 tablet (10 mg total) by mouth daily.  Other orders -     diclofenac Sodium (VOLTAREN) 1 % GEL; Apply 4 g topically 4 (four) times daily. -     hydrochlorothiazide (HYDRODIURIL) 25 MG tablet; Take 1 tablet (25 mg total) by mouth daily. -     loratadine (CLARITIN) 10 MG tablet; Take 1 tablet (10 mg total) by mouth daily.    Follow Up Instructions:    I discussed the assessment and treatment plan with the patient. The patient was provided an opportunity to ask questions and all were answered. The patient agreed with the plan and demonstrated an understanding of the instructions.   The patient was advised to call back or seek an in-person evaluation if the symptoms worsen or if the condition fails to improve as anticipated.  I provided 12 minutes of non-face-to-face time during this encounter.   Marland Kitchen, NP

## 2020-01-23 NOTE — Telephone Encounter (Signed)
I connected with  Amber Crawford on 01/23/20 by a video enabled telemedicine application and verified that I am speaking with the correct person using two identifiers.   I discussed the limitations of evaluation and management by telemedicine. The patient expressed understanding and agreed to proceed.  Maryjean Morn, CMA

## 2020-01-31 ENCOUNTER — Other Ambulatory Visit: Payer: Self-pay

## 2020-01-31 ENCOUNTER — Encounter: Payer: Self-pay | Admitting: Family Medicine

## 2020-01-31 ENCOUNTER — Ambulatory Visit (INDEPENDENT_AMBULATORY_CARE_PROVIDER_SITE_OTHER): Payer: Medicaid Other | Admitting: Family Medicine

## 2020-01-31 ENCOUNTER — Ambulatory Visit (INDEPENDENT_AMBULATORY_CARE_PROVIDER_SITE_OTHER): Payer: Medicaid Other

## 2020-01-31 DIAGNOSIS — M25531 Pain in right wrist: Secondary | ICD-10-CM

## 2020-01-31 MED ORDER — MELOXICAM 15 MG PO TABS: 7.5000 mg | ORAL_TABLET | Freq: Every day | ORAL | 6 refills | Status: DC | PRN

## 2020-01-31 MED ORDER — METHYLPREDNISOLONE 4 MG PO TBPK: ORAL_TABLET | ORAL | 0 refills | Status: DC

## 2020-01-31 NOTE — Patient Instructions (Signed)
     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  El tratamiento es una muequera para dormir durante las prximas 6 a 8 semanas. Si el dolor persiste, ordenaremos una prueba de nervios.  Para la artritis, probaremos 1000 mg de sulfato de [employer] al da.

## 2020-01-31 NOTE — Progress Notes (Signed)
   Office Visit Note   Patient: Amber Crawford           Date of Birth: Jul 28, 1966           MRN: [id number] Visit Date: 01/31/2020 Requested by: Grayce Sessions, NP [address] [place],  [place] [address] PCP: Grayce Sessions, NP  Subjective: Chief Complaint  Patient presents with  . Right Wrist - Pain    3 year onset, pain, numbness tingling sometimes.    HPI: She is here with right wrist pain.  She speaks fairly good [place] and her 55 year old daughter also did a good job helping to translate.  For several years she has had pain in her wrist.  She gets intermittent numbness and tingling in her fingers.  It has gotten worse in the past few months.  She is using Voltaren gel with some improvement.  She is right-hand dominant and has always done jobs requiring repetitive use of her arms.  Currently she cleans houses.  She does not remember any major trauma to her wrist.  No history of diabetes.  Denies any significant neck pain on the right side.                ROS:   All other systems were reviewed and are negative.  Objective: Vital Signs: LMP 01/16/2020 (Exact Date)   Physical Exam:  General:  Alert and oriented, in no acute distress. Pulm:  Breathing unlabored. Psy:  Normal mood, congruent affect. Skin: No erythema Right wrist: She has good range of motion compared to the left.  She has 5/5 upper extremity strength and 2+ DTRs.  She has positive Tinel's at the right carpal tunnel, negative on the left.  Positive Phalen's test.  No thenar atrophy.  Minimal tenderness on the dorsum of her wrist.  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  Possible slight widening of the scapholunate joint.  No definite AVN.   Assessment & Plan: 1.  Chronic right wrist pain with exam and symptoms suggesting carpal tunnel syndrome.  Cannot rule out Kienbock's disease. -We will try a Medrol  Dosepak and carpal tunnel night splint.  Meloxicam as needed.  Glucosamine sulfate. -If symptoms are not improving after 4 to 6 weeks we will do nerve conduction studies and possibly MRI of the wrist.     Procedures: No procedures performed  No notes on file     PMFS History: There are no problems to display for this patient.  Past Medical History:  Diagnosis Date  . Gastritis     History reviewed. No pertinent family history.  Past Surgical History:  Procedure Laterality Date  . CESAREAN SECTION    . CHOLECYSTECTOMY     Social History   Occupational History  . Not on file  Tobacco Use  . Smoking status: Current Some Day Smoker    Packs/day: 0.50    Types: Cigarettes  . Smokeless tobacco: Never Used  Substance and Sexual Activity  . Alcohol use: No  . Drug use: No  . Sexual activity: Not on file

## 2020-02-28 ENCOUNTER — Encounter (INDEPENDENT_AMBULATORY_CARE_PROVIDER_SITE_OTHER): Payer: Medicaid Other | Admitting: Primary Care

## 2020-02-28 ENCOUNTER — Encounter (INDEPENDENT_AMBULATORY_CARE_PROVIDER_SITE_OTHER): Payer: Self-pay | Admitting: Primary Care

## 2020-02-28 ENCOUNTER — Other Ambulatory Visit: Payer: Self-pay

## 2020-02-28 NOTE — Progress Notes (Signed)
Dizziness for about 4 days ago; does not have dizziness today

## 2020-03-06 ENCOUNTER — Ambulatory Visit: Payer: Self-pay

## 2020-03-06 NOTE — Telephone Encounter (Signed)
Pt. Complaining of dizziness on and off x 3 days. Worse with changing positions. Has headaches with this. States she has had this in the past and was treated with medication. No availability this week. Can pt. Be worked in. Please advise pt.Instructed to go to ED for worsening symptoms.  Reason for Disposition  [1] MODERATE dizziness (e.g., interferes with normal activities) AND [2] has been evaluated by physician for this  Answer Assessment - Initial Assessment Questions 1. DESCRIPTION: "Describe your dizziness."     Dizzy 2. LIGHTHEADED: "Do you feel lightheaded?" (e.g., somewhat faint, woozy, weak upon standing)     Woozy 3. VERTIGO: "Do you feel like either you or the room is spinning or tilting?" (i.e. vertigo)     Yes 4. SEVERITY: "How bad is it?"  "Do you feel like you are going to faint?" "Can you stand and walk?"   - MILD: Feels slightly dizzy, but walking normally.   - MODERATE: Feels very unsteady when walking, but not falling; interferes with normal activities (e.g., school, work) .   - SEVERE: Unable to walk without falling, or requires assistance to walk without falling; feels like passing out now.      Moderate 5. ONSET:  "When did the dizziness begin?"     3 days 6. AGGRAVATING FACTORS: "Does anything make it worse?" (e.g., standing, change in head position)     Changing positions 7. HEART RATE: "Can you tell me your heart rate?" "How many beats in 15 seconds?"  (Note: not all patients can do this)       No 8. CAUSE: "What do you think is causing the dizziness?"     Unsure 9. RECURRENT SYMPTOM: "Have you had dizziness before?" If Yes, ask: "When was the last time?" "What happened that time?"     Yes 10. OTHER SYMPTOMS: "Do you have any other symptoms?" (e.g., fever, chest pain, vomiting, diarrhea, bleeding)       Headache 11. PREGNANCY: "Is there any chance you are pregnant?" "When was your last menstrual period?"       No  Protocols used: DIZZINESS [facility]

## 2020-03-06 NOTE — Telephone Encounter (Signed)
Appointment made

## 2020-03-07 ENCOUNTER — Telehealth (INDEPENDENT_AMBULATORY_CARE_PROVIDER_SITE_OTHER): Payer: Medicaid Other | Admitting: Primary Care

## 2020-03-07 ENCOUNTER — Other Ambulatory Visit: Payer: Self-pay

## 2020-03-07 ENCOUNTER — Encounter (INDEPENDENT_AMBULATORY_CARE_PROVIDER_SITE_OTHER): Payer: Self-pay | Admitting: Primary Care

## 2020-03-07 DIAGNOSIS — N912 Amenorrhea, unspecified: Secondary | ICD-10-CM | POA: Diagnosis not present

## 2020-03-07 DIAGNOSIS — I10 Essential (primary) hypertension: Secondary | ICD-10-CM

## 2020-03-07 DIAGNOSIS — R0981 Nasal congestion: Secondary | ICD-10-CM

## 2020-03-07 DIAGNOSIS — R42 Dizziness and giddiness: Secondary | ICD-10-CM | POA: Diagnosis not present

## 2020-03-07 DIAGNOSIS — J3489 Other specified disorders of nose and nasal sinuses: Secondary | ICD-10-CM | POA: Diagnosis not present

## 2020-03-07 MED ORDER — FEXOFENADINE-PSEUDOEPHED ER 60-120 MG PO TB12: 1.0000 | ORAL_TABLET | Freq: Two times a day (BID) | ORAL | 0 refills | Status: DC

## 2020-03-07 MED ORDER — FLUTICASONE PROPIONATE 50 MCG/ACT NA SUSP
2.0000 | Freq: Every day | NASAL | 6 refills | Status: DC
Start: 2020-03-07 — End: 2020-07-10

## 2020-03-07 NOTE — Progress Notes (Signed)
Some dizziness right now Feels like sinus pressure, congestion and headache cause dizziness

## 2020-03-07 NOTE — Progress Notes (Signed)
I connected with Amber Crawford by tele phone  and verified that I am speaking with the correct person using two identifiers. Patient is at home. I discussed the limitations, risks, security and privacy concerns of performing an evaluation and management service by telephone and the availability of in person appointments. I also discussed with the patient that there may be a patient responsible charge related to this service. The patient expressed understanding and agreed to proceed. Amber Crawford at office [facility]  History of Present Illness: [name] has complaints dizziness when she sits up in the bed -positional do not move until gets your bearings.  has sinus pressure, congestion and headache .Blood pressure ranges from systolic 397-673 and diastolic 41-93. Denies shortness of breath, headaches, chest pain or lower extremity edema. Concern she has not had her menstrual cycle in 4 months.    Past Medical History:  Diagnosis Date  . Gastritis    Current Outpatient Medications on File Prior to Visit  Medication Sig Dispense Refill  . atorvastatin (LIPITOR) 20 MG tablet Take 1 tablet (20 mg total) by mouth daily. 90 tablet 3  . Blood Pressure Monitor DEVI 1 kit by Does not apply route 2 (two) times daily as needed. 1 each bag  . busPIRone (BUSPAR) 5 MG tablet Take 1 tablet (5 mg total) by mouth 2 (two) times daily. 180 tablet 1  . diclofenac Sodium (VOLTAREN) 1 % GEL Apply 4 g topically 4 (four) times daily. 350 g 1  . escitalopram (LEXAPRO) 10 MG tablet Take 1 tablet (10 mg total) by mouth daily. 90 tablet 1  . hydrochlorothiazide (HYDRODIURIL) 25 MG tablet Take 1 tablet (25 mg total) by mouth daily. 90 tablet 1  . loratadine (CLARITIN) 10 MG tablet Take 1 tablet (10 mg total) by mouth daily. 30 tablet 11  . meloxicam (MOBIC) 15 MG tablet Take 0.5-1 tablets (7.5-15 mg total) by mouth daily as needed for pain. 30 tablet 6   No current facility-administered  medications on file prior to visit.   Observations/Objective: .Review of Systems  HENT: Positive for congestion and sinus pain.   Genitourinary:       No menstrual cycle   Neurological: Positive for dizziness.  All other systems reviewed and are negative.  Assessment and Plan: Amber Crawford was seen today for dizziness.  Diagnoses and all orders for this visit:  Complaint of nasal congestion May use Flonase to open nasal passages   Amenorrhea absence of menstrual cycle for at least 4 cycles if previously normal menstruation probable perimenopausal explained absent of mensesfor 12 months was the definition of menopause   Vertigo Positional, sinus pressure - positional do not move until gets your bearings. If continue schedule appointment to rule out ear infection.   Sinus pain Pollen trees grass and flowers blooming can be underlining causes with congestion sent in [place] for a short course.  Explained to patient can increase blood pressure use for 7 days with Flonase if no improvement call for in person  Essential hypertension Blood pressure ranges from systolic 790-240 and diastolic 97-35.  No change continue hydrochlorothiazide 25 mg daily.  Readings at home have been at goal less than 130/80 continue to monitor in follow low-sodium diet and exercising routinely. Other orders -     fexofenadine-pseudoephedrine (ALLEGRA-D ALLERGY & CONGESTION) 60-120 MG 12 hr tablet; Take 1 tablet by mouth 2 (two) times daily. -     fluticasone (FLONASE) 50 MCG/ACT nasal spray; Place 2  sprays into both nostrils daily.   T Follow Up Instructions:    I discussed the assessment and treatment plan with the patient. The patient was provided an opportunity to ask questions and all were answered. The patient agreed with the plan and demonstrated an understanding of the instructions.   The patient was advised to call back or seek an in-person evaluation if the symptoms worsen or if the condition fails to  improve as anticipated.  I provided 44mnutes of non-face-to-face time during this encounter.   MKerin Perna NP  MJuluis MireNp-C at office

## 2020-07-03 ENCOUNTER — Other Ambulatory Visit (INDEPENDENT_AMBULATORY_CARE_PROVIDER_SITE_OTHER): Payer: Self-pay | Admitting: Primary Care

## 2020-07-03 NOTE — Telephone Encounter (Signed)
Requested Prescriptions  Pending Prescriptions Disp Refills  . hydrochlorothiazide (HYDRODIURIL) 25 MG tablet [Pharmacy Med Name: HYDROCHLOROTHIAZIDE 25 MG TAB] 90 tablet 0    Sig: TAKE 1 TABLET BY MOUTH EVERY DAY     Cardiovascular: Diuretics - Thiazide Failed - 07/03/2020  2:29 PM      Failed - Last BP in normal range    BP Readings from Last 1 Encounters:  09/20/19 (!) 153/84         Passed - Ca in normal range and within 360 days    Calcium  Date Value Ref Range Status  10/06/2019 9.4 8.7 - 10.2 mg/dL Final         Passed - Cr in normal range and within 360 days    Creatinine, Ser  Date Value Ref Range Status  10/06/2019 0.63 0.57 - 1.00 mg/dL Final         Passed - K in normal range and within 360 days    Potassium  Date Value Ref Range Status  10/06/2019 4.5 3.5 - 5.2 mmol/L Final         Passed - Na in normal range and within 360 days    Sodium  Date Value Ref Range Status  10/06/2019 138 134 - 144 mmol/L Final         Passed - Valid encounter within last 6 months    Recent Outpatient Visits          3 months ago Complaint of nasal congestion   [facility] Grayce Sessions, NP   5 months ago Right wrist pain   [facility] Grayce Sessions, NP   8 months ago Blood pressure check   [facility] Grayce Sessions, NP   8 months ago Essential hypertension   [facility] Grayce Sessions, NP   9 months ago Essential hypertension   [facility] Grayce Sessions, NP

## 2020-07-10 ENCOUNTER — Other Ambulatory Visit: Payer: Self-pay

## 2020-07-10 ENCOUNTER — Ambulatory Visit (INDEPENDENT_AMBULATORY_CARE_PROVIDER_SITE_OTHER): Payer: Medicaid Other | Admitting: Primary Care

## 2020-07-10 ENCOUNTER — Encounter (INDEPENDENT_AMBULATORY_CARE_PROVIDER_SITE_OTHER): Payer: Self-pay | Admitting: Primary Care

## 2020-07-10 VITALS — BP 115/67 | HR 75 | Temp 97.7°F | Ht 60.0 in | Wt 121.2 lb

## 2020-07-10 DIAGNOSIS — I10 Essential (primary) hypertension: Secondary | ICD-10-CM | POA: Diagnosis not present

## 2020-07-10 DIAGNOSIS — Z23 Encounter for immunization: Secondary | ICD-10-CM | POA: Diagnosis not present

## 2020-07-10 DIAGNOSIS — E782 Mixed hyperlipidemia: Secondary | ICD-10-CM | POA: Diagnosis not present

## 2020-07-10 DIAGNOSIS — R0981 Nasal congestion: Secondary | ICD-10-CM

## 2020-07-10 DIAGNOSIS — J302 Other seasonal allergic rhinitis: Secondary | ICD-10-CM | POA: Diagnosis not present

## 2020-07-10 MED ORDER — HYDROCHLOROTHIAZIDE 25 MG PO TABS: 25.0000 mg | ORAL_TABLET | Freq: Every day | ORAL | 0 refills | Status: DC

## 2020-07-10 MED ORDER — LORATADINE 10 MG PO TABS: 10.0000 mg | ORAL_TABLET | Freq: Every day | ORAL | 1 refills | Status: DC

## 2020-07-10 MED ORDER — FLUTICASONE PROPIONATE 50 MCG/ACT NA SUSP: 2.0000 | Freq: Every day | NASAL | 6 refills | Status: DC

## 2020-07-10 NOTE — Patient Instructions (Signed)
Gripe en los adultos Influenza, Adult A la gripe tambin se la conoce como "influenza". Es una infeccin en los pulmones, la nariz y la garganta (vas respiratorias). La causa un virus. La gripe provoca sntomas que son similares a los de un resfro. Tambin causa fiebre alta y dolores corporales. Se transmite fcilmente de persona a persona (es contagiosa). La mejor manera de prevenir la gripe es aplicndose la vacuna contra la gripe todos los aos. Cules son las causas? La causa de esta afeccin es el virus de la influenza. Puede contraer el virus de las siguientes maneras:  Respirar las gotitas que estn en el aire y que provienen de la tos o el estornudo de una persona que tiene el virus.  Tocar algo que tiene el virus (est contaminado) y luego tocarse la boca, la nariz o los ojos. Qu incrementa el riesgo? Hay ciertas cosas que lo pueden hacer ms propenso a tener gripe. Estas incluyen lo siguiente:  No lavarse las manos con frecuencia.  Tener contacto cercano con muchas personas durante la temporada de resfro y gripe.  Tocarse la boca, los ojos o la nariz sin antes lavarse las manos.  No recibir la vacuna antigripal todos los aos. Puede correr un mayor riesgo de tener gripe, junto con problemas graves como una infeccin pulmonar (neumona), si:  Es mayor de 65 aos de edad.  Est embarazada.  Tiene debilitado el sistema que combate las defensas (sistema inmunitario) debido a una enfermedad o porque toma determinados medicamentos.  Tiene una enfermedad prolongada (crnica), por ejemplo: ? Enfermedad cardaca, renal o pulmonar. ? Diabetes. ? Asma.  Tiene un trastorno heptico.  Tiene mucho sobrepeso (obesidad mrbida).  Tiene anemia. Esta es una afeccin que afecta a los glbulos rojos. Cules son los signos o los sntomas? Los sntomas normalmente comienzan de repente y duran entre 4 y 14 das. Pueden incluir los siguientes:  Fiebre y escalofros.  Dolores de  cabeza, dolores en el cuerpo o dolores musculares.  Dolor de garganta.  Tos.  Secrecin o congestin nasal.  Malestar en el pecho.  No desear comer en las cantidades normales (prdida del apetito).  Debilidad o cansancio (fatiga).  Mareos.  Malestar estomacal (nuseas) o ganas de devolver (vmitos). Cmo se trata? Si la gripe se encuentra de forma temprana, se la puede tratar con medicamentos que pueden ayudar a reducir la gravedad de la enfermedad y reducir su duracin (medicamentos antivirales). Estos pueden administrarse por boca (va oral) o por va (catter) intravenosa. Cuidarse en su hogar puede ayudar a que mejoren los sntomas. El mdico puede sugerirle lo siguiente:  Tomar medicamentos de venta libre.  Beber mucho lquido. La gripe suele desaparecer sola. Si tiene sntomas muy graves u otros problemas, puede recibir tratamiento en un hospital. Siga estas indicaciones en su casa:     Actividad  Descanse todo lo que sea necesario. Duerma lo suficiente.  Qudese en su casa y no concurra al trabajo o a la escuela, como se lo haya indicado el mdico. ? No salga de su casa hasta que no haya tenido fiebre por 24horas sin tomar medicamentos. ? Salga de su casa solo para ir al mdico. Comida y bebida  Tome una SRO (solucin de rehidratacin oral). Es una bebida que se vende en farmacias y tiendas.  Beba suficiente lquido para mantener el pis (la orina) de color amarillo plido.  En la medida en que pueda, beba lquidos claros en pequeas cantidades. Los lquidos transparentes son, por ejemplo: ? Agua. ? Trocitos   de hielo. ? Jugo de frutas con agua agregada (jugo de frutas diluido). ? Bebidas deportivas de bajas caloras.  En la medida en que pueda, consuma alimentos blandos y fciles de digerir en pequeas cantidades. Estos alimentos incluyen: ? Bananas. ? Pur de manzana. ? Arroz. ? Carnes magras. ? Tostadas. ? Galletas.  No coma ni beba lo  siguiente: ? Lquidos con alto contenido de azcar o cafena. ? Alcohol. ? Alimentos condimentados o con alto contenido de grasa. Indicaciones generales  Tome los medicamentos de venta libre y los recetados solamente como se lo haya indicado el mdico.  Use un humidificador de aire fro para que el aire de su casa est ms hmedo. Esto puede facilitar la respiracin.  Al toser o estornudar, cbrase la boca y la nariz.  Lvese las manos con agua y jabn frecuentemente, en especial despus de toser o estornudar. Use desinfectante para manos con alcohol si no dispone de agua y jabn.  Concurra a todas las visitas de control como se lo haya indicado el mdico. Esto es importante. Cmo se evita?   Colquese la vacuna antigripal todos los aos. Puede colocarse la vacuna contra la gripe a fines de verano, en otoo o en invierno. Pregntele al mdico cundo debe aplicarse la vacuna contra la gripe.  Evite el contacto con personas que estn enfermas durante el otoo y el invierno (la temporada de resfro y gripe). Comunquese con un mdico si:  Tiene sntomas nuevos.  Tiene los siguientes sntomas: ? Dolor en el pecho. ? Materia fecal lquida (diarrea). ? Fiebre.  La tos empeora.  Empieza a tener ms mucosidad.  Tiene malestar estomacal.  Vomita. Solicite ayuda inmediatamente si:  Le falta el aire.  Tiene dificultad para respirar.  La piel o las uas se ponen de un color azulado.  Presenta dolor muy intenso o rigidez en el cuello.  Tiene dolor de cabeza repentino.  Le duele la cara o el odo de forma repentina.  No puede comer ni beber sin vomitar. Resumen  La gripe es una infeccin en los pulmones, la nariz y la garganta. La causa un virus.  Tome los medicamentos de venta libre y los recetados solamente como se lo haya indicado el mdico.  Aplicarse la vacuna contra la gripe todos los aos es la mejor manera de evitar contagiarse la gripe. Esta informacin no tiene  como fin reemplazar el consejo del mdico. Asegrese de hacerle al mdico cualquier pregunta que tenga. Document Revised: 03/23/2018 Document Reviewed: 03/23/2018 Elsevier Patient Education  2020 Elsevier Inc.  

## 2020-07-10 NOTE — Progress Notes (Signed)
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  Current Medication List Current Outpatient Medications on File Prior to Visit  Medication Sig Dispense Refill  . atorvastatin (LIPITOR) 20 MG tablet Take 1 tablet (20 mg total) by mouth daily. 90 tablet 3  . Blood Pressure Monitor DEVI 1 kit by Does not apply route 2 (two) times daily as needed. 1 each bag  . diclofenac Sodium (VOLTAREN) 1 % GEL Apply 4 g topically 4 (four) times daily. 350 g 1  . meloxicam (MOBIC) 15 MG tablet Take 0.5-1 tablets (7.5-15 mg total) by mouth daily as needed for pain. 30 tablet 6   No current facility-administered medications on file prior to visit.   Past Medical History  Past Medical History:  Diagnosis Date  . Gastritis    Dietary habits include: makes better food choices for 2 reasons -1. Certain foods causes gastritis and 2. Monitoring sodium Exercise habits include:walking Family / Social history: None   ASCVD risk factors include- CMali O:  Physical Exam Vitals reviewed.  Constitutional:      Appearance: Normal appearance. She is normal weight.  HENT:     Head: Normocephalic.     Nose: Nose normal.  Eyes:     Extraocular Movements: Extraocular movements intact.  Cardiovascular:     Rate and Rhythm: Normal rate and regular rhythm.  Pulmonary:     Effort: Pulmonary effort is normal.     Breath sounds: Normal breath sounds.  Abdominal:     General: Abdomen is flat. Bowel sounds are normal.     Palpations: Abdomen is soft.  Musculoskeletal:        General: Normal range of motion.     Cervical back: Normal range of motion and neck supple.  Skin:    General: Skin is warm and dry.  Neurological:     Mental Status: She is oriented to person, place, and time.   Psychiatric:        Mood and Affect: Mood normal.        Behavior: Behavior normal.        Thought Content: Thought content normal.        Judgment: Judgment normal.      Review of Systems  HENT: Positive for congestion.   Eyes:       Watery eyes  Neurological: Positive for headaches.  All other systems reviewed and are negative.   Last 3 Office BP readings: BP Readings from Last 3 Encounters:  07/10/20 115/67  09/20/19 (!) 153/84  08/29/19 (!) 149/85    BMET    Component Value Date/Time   NA 138 10/06/2019 0855   K 4.5 10/06/2019 0855   CL 99 10/06/2019 0855   CO2 21 10/06/2019 0855   GLUCOSE 90 10/06/2019 0855   GLUCOSE 114 (H) 04/06/2018 1314   BUN 14 10/06/2019 0855   CREATININE 0.63 10/06/2019 0855   CALCIUM 9.4 10/06/2019 0855   GFRNONAA 102 10/06/2019 0855   GFRAA 118 10/06/2019 0855    Renal function: CrCl cannot be calculated (Patient's most recent lab result is older than the maximum 21 days allowed.).  Clinical ASCVD: Yes  The 10-year ASCVD risk score (Amber BussingDC Jr., et al., 2013) is: 6.8%   Values used to calculate the score:  Age: 28 years     Sex: Female     Is Non-Hispanic African American: No     Diabetic: No     Tobacco smoker: Yes     Systolic Blood Pressure: 944 mmHg     Is BP treated: Yes     HDL Cholesterol: 65 mg/dL     Total Cholesterol: 299 mg/dL   A/P: Essential hypertension Hypertension newly diagnosed currently HCTZ 90m  on current medications. BP Goal = 130/80  mmHg. Patient is adherent with current medications.  -Continued  -F/u labs ordered - future -Counseled on lifestyle modifications for blood pressure control including reduced dietary sodium, increased exercise, adequate sleep -     hydrochlorothiazide (HYDRODIURIL) 25 MG tablet; Take 1 tablet (25 mg total) by mouth daily. -     CMP14+EGFR; Future -     CBC with Differential/Platelet; Future  Mixed hyperlipidemia Decrease your fatty foods, red meat, cheese, milk  and increase fiber like whole grains and veggies. You can also add a fiber supplement like Metamucil or Benefiber.  -     Lipid panel; Future  Seasonal allergies -     fluticasone (FLONASE) 50 MCG/ACT nasal spray; Place 2 sprays into both nostrils daily. -     loratadine (CLARITIN) 10 MG tablet; Take 1 tablet (10 mg total) by mouth daily.  Complaint of nasal congestion -     fluticasone (FLONASE) 50 MCG/ACT nasal spray; Place 2 sprays into both nostrils daily.  Need for immunization against influenza -     Flu Vaccine QUAD 36+ mos IM   MKerin Perna

## 2020-07-11 ENCOUNTER — Other Ambulatory Visit (INDEPENDENT_AMBULATORY_CARE_PROVIDER_SITE_OTHER): Payer: Medicaid Other

## 2020-07-11 DIAGNOSIS — I10 Essential (primary) hypertension: Secondary | ICD-10-CM

## 2020-07-11 DIAGNOSIS — E782 Mixed hyperlipidemia: Secondary | ICD-10-CM

## 2020-07-12 LAB — CMP14+EGFR
ALT: 10 IU/L (ref 0–32)
AST: 9 IU/L (ref 0–40)
Albumin/Globulin Ratio: 2.1 (ref 1.2–2.2)
Albumin: 4.4 g/dL (ref 3.8–4.9)
Alkaline Phosphatase: 73 IU/L (ref 44–121)
BUN/Creatinine Ratio: 26 — ABNORMAL HIGH (ref 9–23)
BUN: 15 mg/dL (ref 6–24)
Bilirubin Total: 0.5 mg/dL (ref 0.0–1.2)
CO2: 25 mmol/L (ref 20–29)
Calcium: 9.2 mg/dL (ref 8.7–10.2)
Chloride: 105 mmol/L (ref 96–106)
Creatinine, Ser: 0.58 mg/dL (ref 0.57–1.00)
GFR calc Af Amer: 120 mL/min/{1.73_m2} (ref >59)
GFR calc non Af Amer: 104 mL/min/{1.73_m2} (ref >59)
Globulin, Total: 2.1 g/dL (ref 1.5–4.5)
Glucose: 85 mg/dL (ref 65–99)
Potassium: 3.5 mmol/L (ref 3.5–5.2)
Sodium: 142 mmol/L (ref 134–144)
Total Protein: 6.5 g/dL (ref 6.0–8.5)

## 2020-07-12 LAB — CBC WITH DIFFERENTIAL/PLATELET
Basophils Absolute: 0.1 10*3/uL (ref 0.0–0.2)
Basos: 1 %
EOS (ABSOLUTE): 0.4 10*3/uL (ref 0.0–0.4)
Eos: 4 %
Hematocrit: 41.8 % (ref 34.0–46.6)
Hemoglobin: 14.7 g/dL (ref 11.1–15.9)
Immature Grans (Abs): 0 10*3/uL (ref 0.0–0.1)
Immature Granulocytes: 0 %
Lymphocytes Absolute: 2.4 10*3/uL (ref 0.7–3.1)
Lymphs: 30 %
MCH: 31.7 pg (ref 26.6–33.0)
MCHC: 35.2 g/dL (ref 31.5–35.7)
MCV: 90 fL (ref 79–97)
Monocytes Absolute: 0.6 10*3/uL (ref 0.1–0.9)
Monocytes: 7 %
Neutrophils Absolute: 4.7 10*3/uL (ref 1.4–7.0)
Neutrophils: 58 %
Platelets: 225 10*3/uL (ref 150–450)
RBC: 4.63 x10E6/uL (ref 3.77–5.28)
RDW: 13.3 % (ref 11.7–15.4)
WBC: 8.2 10*3/uL (ref 3.4–10.8)

## 2020-07-12 LAB — LIPID PANEL
Chol/HDL Ratio: 2.8 ratio (ref 0.0–4.4)
Cholesterol, Total: 162 mg/dL (ref 100–199)
HDL: 57 mg/dL (ref >39)
LDL Chol Calc (NIH): 87 mg/dL (ref 0–99)
Triglycerides: 97 mg/dL (ref 0–149)
VLDL Cholesterol Cal: 18 mg/dL (ref 5–40)

## 2020-07-15 ENCOUNTER — Telehealth (INDEPENDENT_AMBULATORY_CARE_PROVIDER_SITE_OTHER): Payer: Self-pay

## 2020-07-15 NOTE — Telephone Encounter (Signed)
-----   Message from Grayce Sessions, NP sent at 07/12/2020  8:50 PM EST ----- All labs are normal

## 2020-07-15 NOTE — Telephone Encounter (Signed)
Patient is aware that labs are normal. Amber Crawford Jen Benedict, CMA  

## 2020-08-07 ENCOUNTER — Telehealth (INDEPENDENT_AMBULATORY_CARE_PROVIDER_SITE_OTHER): Payer: Self-pay | Admitting: Primary Care

## 2020-08-07 ENCOUNTER — Ambulatory Visit (INDEPENDENT_AMBULATORY_CARE_PROVIDER_SITE_OTHER): Payer: Self-pay | Admitting: *Deleted

## 2020-08-07 NOTE — Telephone Encounter (Signed)
Pt calling stating that she has gastritis and it is causing her a lot of discomfort. Pt states that she is scared to eat due to this and is requesting to know if something else can be sent in for her. Please advise.     [id number] Judithann Sheen, [place] - [address]  [address] [place] [address]  Phone: [phone] Fax: [phone]  Hours: Not open 24 hours

## 2020-08-07 NOTE — Telephone Encounter (Signed)
Patient asking if okay to drink aloe vera juice for gas she is having. No pain or difficulties just gas. Offered advice on OTC anti-gas tabs, increased water intake and movement. Aloe vera juice is known to be safe in small doses.

## 2020-08-07 NOTE — Telephone Encounter (Signed)
  Reason for Disposition . [1] Caller requesting NON-URGENT health information AND [2] PCP's office is the best resource  Answer Assessment - Initial Assessment Questions 1. REASON FOR CALL or QUESTION: "What is your reason for calling today?" or "How can I best help you?" or "What question do you have that I can help answer?"    Is aloe vera juice good for gas? Can I take it.  Protocols used: INFORMATION ONLY CALL - NO TRIAGE-A-AH

## 2020-08-14 ENCOUNTER — Other Ambulatory Visit (INDEPENDENT_AMBULATORY_CARE_PROVIDER_SITE_OTHER): Payer: Self-pay | Admitting: Primary Care

## 2020-08-14 DIAGNOSIS — K219 Gastro-esophageal reflux disease without esophagitis: Secondary | ICD-10-CM

## 2020-08-14 MED ORDER — OMEPRAZOLE 20 MG PO CPDR: 20.0000 mg | DELAYED_RELEASE_CAPSULE | Freq: Every day | ORAL | 3 refills | Status: DC

## 2020-08-14 NOTE — Telephone Encounter (Signed)
Could you call patient and inform to try OTC gas X or medication specifically for gas.

## 2020-08-26 ENCOUNTER — Ambulatory Visit (INDEPENDENT_AMBULATORY_CARE_PROVIDER_SITE_OTHER): Payer: Self-pay | Admitting: *Deleted

## 2020-08-26 NOTE — Telephone Encounter (Signed)
Patient called via interpreter [phone] to answer questions for advise. Patient reports she was diagnosed with covid 08/15/20 and is feeling better but continues to have cough with some phlegm that is clear, white. Pain reported in nose and nasal spray is helping. Can PCP prescribe any medication for her cough? Patient is concerned taking OTC medications due to having high blood pressure. Encouraged patient to contact her pharmacist for recommendations for OTC cough medications. Patient reports she is taking 500 mg tylenol and NT reviewed not to exceed 4000 mg of tylenol within 24 hour period. Patient verbalized understanding. Patient asked when could she return to work. Patient denies fever and has been quarantine since 08/15/20. Reviewed updated CDC guidelines to quarantine 5 days and if no fever wear a mask for 5 additional days around others. Please advise if patient able to return to work by PCP. Care advise given. Patient verbalized understanding of care advise and to call back if symptoms worsen.   Reason for Disposition . General information question, no triage required and triager able to answer question  Answer Assessment - Initial Assessment Questions 1. REASON FOR CALL or QUESTION: "What is your reason for calling today?" or "How can I best help you?" or "What question do you have that I can help answer?"     What medications can she take for her persistent cough, OTC because she has high blood pressure? And when can she return to work?  Protocols used: INFORMATION ONLY CALL - NO TRIAGE-A-AH

## 2020-08-27 ENCOUNTER — Encounter (INDEPENDENT_AMBULATORY_CARE_PROVIDER_SITE_OTHER): Payer: Self-pay | Admitting: Primary Care

## 2020-08-27 NOTE — Telephone Encounter (Signed)
Patient may return to work note will be sent.

## 2020-08-28 ENCOUNTER — Ambulatory Visit (INDEPENDENT_AMBULATORY_CARE_PROVIDER_SITE_OTHER): Payer: Self-pay | Admitting: *Deleted

## 2020-08-28 NOTE — Telephone Encounter (Signed)
Diagnosed with Covid last week. Mostly improved. Dry cough persist. Feels a pinch in the back with coughing.  Denies SOB. Recommended Coricidin HBP OTC, increase daily water intake, keep room temperature on the cool side, avoid smoking. If cough persist 3 weeks or any difficulty breathing call back or seek treatment at [facility]. Patient verbalized understanding information provided.

## 2020-08-28 NOTE — Telephone Encounter (Signed)
  Reason for Disposition . Cough  Answer Assessment - Initial Assessment Questions 1. ONSET: "When did the cough begin?"     Last week 2. SEVERITY: "How bad is the cough today?"      bothersome 3. SPUTUM: "Describe the color of your sputum" (none, dry cough; clear, white, yellow, green)     none 4. HEMOPTYSIS: "Are you coughing up any blood?" If so ask: "How much?" (flecks, streaks, tablespoons, etc.)     no 5. DIFFICULTY BREATHING: "Are you having difficulty breathing?" If Yes, ask: "How bad is it?" (e.g., mild, moderate, severe)    - MILD: No SOB at rest, mild SOB with walking, speaks normally in sentences, can lay down, no retractions, pulse < 100.    - MODERATE: SOB at rest, SOB with minimal exertion and prefers to sit, cannot lie down flat, speaks in phrases, mild retractions, audible wheezing, pulse 100-120.    - SEVERE: Very SOB at rest, speaks in single words, struggling to breathe, sitting hunched forward, retractions, pulse > 120     Denies SOB 6. FEVER: "Do you have a fever?" If Yes, ask: "What is your temperature, how was it measured, and when did it start?"     N/A 7. CARDIAC HISTORY: "Do you have any history of heart disease?" (e.g., heart attack, congestive heart failure)      Hypertension 8. LUNG HISTORY: "Do you have any history of lung disease?"  (e.g., pulmonary embolus, asthma, emphysema)     No 9. PE RISK FACTORS: "Do you have a history of blood clots?" (or: recent major surgery, recent prolonged travel, bedridden)     NA 10. OTHER SYMPTOMS: "Do you have any other symptoms?" (e.g., runny nose, wheezing, chest pain)       No 11. PREGNANCY: "Is there any chance you are pregnant?" "When was your last menstrual period?"       Did not ask 12. TRAVEL: "Have you traveled out of the country in the last month?" (e.g., travel history, exposures)       NA  Protocols used: COUGH - ACUTE NON-PRODUCTIVE-A-AH

## 2020-08-28 NOTE — Telephone Encounter (Signed)
Please call patient and let her know that she may return to work. Letter is available for pick up.

## 2020-08-28 NOTE — Telephone Encounter (Signed)
Patient informed of letter

## 2020-08-29 ENCOUNTER — Encounter (HOSPITAL_COMMUNITY): Payer: Self-pay

## 2020-08-29 ENCOUNTER — Ambulatory Visit (INDEPENDENT_AMBULATORY_CARE_PROVIDER_SITE_OTHER): Payer: Medicaid Other

## 2020-08-29 ENCOUNTER — Other Ambulatory Visit: Payer: Self-pay

## 2020-08-29 ENCOUNTER — Ambulatory Visit (HOSPITAL_COMMUNITY)
Admission: EM | Admit: 2020-08-29 | Discharge: 2020-08-29 | Disposition: A | Discharge status: Home / Self Care | Payer: Medicaid Other | Attending: Internal Medicine | Admitting: Internal Medicine

## 2020-08-29 DIAGNOSIS — R0602 Shortness of breath: Secondary | ICD-10-CM

## 2020-08-29 DIAGNOSIS — U071 COVID-19: Secondary | ICD-10-CM | POA: Diagnosis not present

## 2020-08-29 DIAGNOSIS — R062 Wheezing: Secondary | ICD-10-CM

## 2020-08-29 MED ORDER — ALBUTEROL SULFATE HFA 108 (90 BASE) MCG/ACT IN AERS: 2.0000 | INHALATION_SPRAY | Freq: Four times a day (QID) | RESPIRATORY_TRACT | 0 refills | Status: DC | PRN

## 2020-08-29 MED ORDER — BENZONATATE 100 MG PO CAPS: 100.0000 mg | ORAL_CAPSULE | Freq: Three times a day (TID) | ORAL | 0 refills | Status: DC | PRN

## 2020-08-29 NOTE — ED Provider Notes (Signed)
[place]    CSN: [id number] Arrival date & time: 08/29/20  3536      History   Chief Complaint Chief Complaint  Patient presents with  . Cough  . Shortness of Breath    HPI Amber Crawford is a 55 y.o. female with past medical history of hypertension and hyperlipidemia presents to urgent care with complaints of cough and shortness of breath.  Patient diagnosed with COVID-19 on 12/31.  States she is feeling significantly better but with persistent cough and "whistling noise" occasionally while breathing.  Patient states symptoms are worse when lying down.  She denies any recent fever/chills, chest pain, headache, dizziness, palpitations, LE swelling.   Past Medical History:  Diagnosis Date  . Gastritis     There are no problems to display for this patient.   Past Surgical History:  Procedure Laterality Date  . CESAREAN SECTION    . CHOLECYSTECTOMY      OB History    Gravida  5   Para      Term      Preterm      AB  1   Living  4     SAB      IAB  1   Ectopic      Multiple      Live Births               Home Medications    Prior to Admission medications   Medication Sig Start Date End Date Taking? Authorizing Provider  albuterol (VENTOLIN HFA) 108 (90 Base) MCG/ACT inhaler Inhale 2 puffs into the lungs every 6 (six) hours as needed for wheezing or shortness of breath. 08/29/20  Yes Rudolpho Sevin, NP  benzonatate (TESSALON PERLES) 100 MG capsule Take 1 capsule (100 mg total) by mouth 3 (three) times daily as needed for cough. 08/29/20 08/29/21 Yes Rudolpho Sevin, NP  atorvastatin (LIPITOR) 20 MG tablet Take 1 tablet (20 mg total) by mouth daily. 10/09/19   [name], NP  Blood Pressure Monitor DEVI 1 kit by Does not apply route 2 (two) times daily as needed. 09/20/19   [name], NP  diclofenac Sodium (VOLTAREN) 1 % GEL Apply 4 g topically 4 (four) times daily. 01/23/20   [name], NP  fluticasone  (FLONASE) 50 MCG/ACT nasal spray Place 2 sprays into both nostrils daily. 07/10/20   [name], NP  hydrochlorothiazide (HYDRODIURIL) 25 MG tablet Take 1 tablet (25 mg total) by mouth daily. 07/10/20   [name], NP  loratadine (CLARITIN) 10 MG tablet Take 1 tablet (10 mg total) by mouth daily. 07/10/20   [name], NP  meloxicam (MOBIC) 15 MG tablet Take 0.5-1 tablets (7.5-15 mg total) by mouth daily as needed for pain. 01/31/20   Hilts, Legrand Como, MD  omeprazole (PRILOSEC) 20 MG capsule Take 1 capsule (20 mg total) by mouth daily. 08/14/20   [name], NP    Family History Family History  Family history unknown: Yes    Social History Social History   Tobacco Use  . Smoking status: Current Some Day Smoker    Packs/day: 0.50    Types: Cigarettes  . Smokeless tobacco: Never Used  Vaping Use  . Vaping Use: Never used  Substance Use Topics  . Alcohol use: No  . Drug use: No     Allergies   Aspirin   Review of Systems As stated in HPI otherwise negative  Physical Exam Triage Vital Signs ED Triage Vitals  Enc Vitals Group     BP 08/29/20 0853 134/68     Pulse Rate 08/29/20 0853 66     Resp 08/29/20 0853 17     Temp 08/29/20 0853 98.1 F (36.7 C)     Temp Source 08/29/20 0853 Oral     SpO2 08/29/20 0853 100 %     Weight --      Height --      Head Circumference --      Peak Flow --      Pain Score 08/29/20 0854 1     Pain Loc --      Pain Edu? --      Excl. in [place]? --    No data found.  Updated Vital Signs BP 134/68 (BP Location: Right Arm)   Pulse 66   Temp 98.1 F (36.7 C) (Oral)   Resp 17   SpO2 100%   Visual Acuity Right Eye Distance:   Left Eye Distance:   Bilateral Distance:    Right Eye Near:   Left Eye Near:    Bilateral Near:     Physical Exam Constitutional:      General: She is not in acute distress.    Appearance: She is well-developed. She is not ill-appearing or toxic-appearing.  Cardiovascular:      Rate and Rhythm: Normal rate and regular rhythm.     Heart sounds: No murmur heard. No friction rub. No gallop.   Pulmonary:     Effort: Pulmonary effort is normal. No tachypnea or accessory muscle usage.     Comments: Decreased inspiratory effort.  No increased work of breathing.  No wheezes rales or crackles. Abdominal:     General: Bowel sounds are normal.     Palpations: Abdomen is soft.  Musculoskeletal:     Cervical back: Normal range of motion and neck supple.     Right lower leg: No edema.     Left lower leg: No edema.  Lymphadenopathy:     Cervical: No cervical adenopathy.  Skin:    General: Skin is warm and dry.  Neurological:     General: No focal deficit present.     Mental Status: She is alert.  Psychiatric:        Mood and Affect: Mood normal.        Behavior: Behavior normal.      UC Treatments / Results  Labs (all labs ordered are listed, but only abnormal results are displayed) Labs Reviewed - No data to display  EKG   Radiology DG Chest 2 View  Result Date: 08/29/2020 CLINICAL DATA:  Shortness of breath, wheezing, recent COVID. EXAM: CHEST - 2 VIEW COMPARISON:  February 13, 2012. FINDINGS: The heart size and mediastinal contours are within normal limits. Both lungs are clear. No visible pleural effusions or pneumothorax. No acute osseous abnormality. Mild thoracolumbar S-shaped curvature. Right upper quadrant clips. IMPRESSION: No active cardiopulmonary disease. Electronically Signed   By: Margaretha Sheffield MD   On: 08/29/2020 09:28    Procedures Procedures (including critical care time)  Medications Ordered in UC Medications - No data to display  Initial Impression / Assessment and Plan / UC Course  I have reviewed the triage vital signs and the nursing notes.  Pertinent labs & imaging results that were available during my care of the patient were reviewed by me and considered in my medical decision making (see chart for  details).  COVID-19 -  Diagnosed on 12/31 with improving symptoms but still with some residual dyspnea -VSS, exam unremarkable, no evidence of pneumonia or heart failure on chest x-ray -Discussed progression of Covid virus along with indications for follow-up -Albuterol MDI as needed, Tessalon Perles as needed  Reviewed expections re: course of current medical issues. Questions answered. Outlined signs and symptoms indicating need for more acute intervention. Pt verbalized understanding. AVS given  Final Clinical Impressions(s) / UC Diagnoses   Final diagnoses:  COVID-19  Shortness of breath     Discharge Instructions     Your chest x-ray today looks good.  Your symptoms are likely due to recent Covid illness.  Use albuterol inhaler every 4-6 hours as needed for shortness of breath.  Use Tessalon Perles as needed for cough.  Please return or go to emergency department for any worsening shortness of breath or for chest pain.    ED Prescriptions    Medication Sig Dispense Auth. Provider   albuterol (VENTOLIN HFA) 108 (90 Base) MCG/ACT inhaler Inhale 2 puffs into the lungs every 6 (six) hours as needed for wheezing or shortness of breath. 8 g Rudolpho Sevin, NP   benzonatate (TESSALON PERLES) 100 MG capsule Take 1 capsule (100 mg total) by mouth 3 (three) times daily as needed for cough. 30 capsule Rudolpho Sevin, NP     PDMP not reviewed this encounter.   Rudolpho Sevin, NP 08/29/20 1019

## 2020-08-29 NOTE — ED Triage Notes (Signed)
Pt presents with non productive cough and shortness of breath when laying down X 3 days.

## 2020-08-29 NOTE — Discharge Instructions (Signed)
Your chest x-ray today looks good.  Your symptoms are likely due to recent Covid illness.  Use albuterol inhaler every 4-6 hours as needed for shortness of breath.  Use Tessalon Perles as needed for cough.  Please return or go to emergency department for any worsening shortness of breath or for chest pain.

## 2020-10-07 ENCOUNTER — Other Ambulatory Visit: Payer: Self-pay

## 2020-10-07 ENCOUNTER — Ambulatory Visit (HOSPITAL_COMMUNITY)
Admission: EM | Admit: 2020-10-07 | Discharge: 2020-10-07 | Disposition: A | Discharge status: Home / Self Care | Payer: Medicaid Other | Attending: Urgent Care | Admitting: Urgent Care

## 2020-10-07 ENCOUNTER — Encounter (HOSPITAL_COMMUNITY): Payer: Self-pay | Admitting: Emergency Medicine

## 2020-10-07 DIAGNOSIS — J3089 Other allergic rhinitis: Secondary | ICD-10-CM

## 2020-10-07 DIAGNOSIS — M62838 Other muscle spasm: Secondary | ICD-10-CM | POA: Diagnosis not present

## 2020-10-07 DIAGNOSIS — M549 Dorsalgia, unspecified: Secondary | ICD-10-CM

## 2020-10-07 MED ORDER — NAPROXEN 500 MG PO TABS: 500.0000 mg | ORAL_TABLET | Freq: Two times a day (BID) | ORAL | 0 refills | Status: DC

## 2020-10-07 MED ORDER — TIZANIDINE HCL 4 MG PO TABS: 4.0000 mg | ORAL_TABLET | Freq: Every day | ORAL | 0 refills | Status: DC

## 2020-10-07 NOTE — ED Triage Notes (Signed)
Pt presents with left shoulder and arm pain xs 2 weeks. Also c/o of back pain. Denies any fall or injury.

## 2020-10-07 NOTE — ED Provider Notes (Signed)
[place]   MRN: [id number] DOB: 1965/11/11  Subjective:   Amber Crawford is a 55 y.o. female presenting for 2 week history of acute onset recurrent left-sided upper back pain, left-sided neck tightness.  Denies fall, trauma, car accident.  Has used Tylenol and patches for relief.  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  Has previously tried to get a massage but did not like how much it hurt.  Denies numbness or tingling, weakness, rashes.  Does not hydrate with water, drinks coffee.  No alcohol use.  No current facility-administered medications for this encounter.  Current Outpatient Medications:  .  albuterol (VENTOLIN HFA) 108 (90 Base) MCG/ACT inhaler, Inhale 2 puffs into the lungs every 6 (six) hours as needed for wheezing or shortness of breath., Disp: 8 g, Rfl: 0 .  atorvastatin (LIPITOR) 20 MG tablet, Take 1 tablet (20 mg total) by mouth daily., Disp: 90 tablet, Rfl: 3 .  benzonatate (TESSALON PERLES) 100 MG capsule, Take 1 capsule (100 mg total) by mouth 3 (three) times daily as needed for cough., Disp: 30 capsule, Rfl: 0 .  Blood Pressure Monitor DEVI, 1 kit by Does not apply route 2 (two) times daily as needed., Disp: 1 each, Rfl: bag .  diclofenac Sodium (VOLTAREN) 1 % GEL, Apply 4 g topically 4 (four) times daily., Disp: 350 g, Rfl: 1 .  fluticasone (FLONASE) 50 MCG/ACT nasal spray, Place 2 sprays into both nostrils daily., Disp: 16 g, Rfl: 6 .  hydrochlorothiazide (HYDRODIURIL) 25 MG tablet, Take 1 tablet (25 mg total) by mouth daily., Disp: 90 tablet, Rfl: 0 .  loratadine (CLARITIN) 10 MG tablet, Take 1 tablet (10 mg total) by mouth daily., Disp: 90 tablet, Rfl: 1 .  meloxicam (MOBIC) 15 MG tablet, Take 0.5-1 tablets (7.5-15 mg total) by mouth daily as needed for pain., Disp: 30 tablet, Rfl: 6 .  omeprazole (PRILOSEC) 20 MG capsule, Take 1 capsule (20 mg total) by mouth daily., Disp: 30  capsule, Rfl: 3   Allergies  Allergen Reactions  . Aspirin Other (See Comments)    Fingers get numb; OK to take acetaminophen and IBU    Past Medical History:  Diagnosis Date  . Gastritis      Past Surgical History:  Procedure Laterality Date  . CESAREAN SECTION    . CHOLECYSTECTOMY      Family History  Family history unknown: Yes    Social History   Tobacco Use  . Smoking status: Current Some Day Smoker    Packs/day: 0.50    Types: Cigarettes  . Smokeless tobacco: Never Used  Vaping Use  . Vaping Use: Never used  Substance Use Topics  . Alcohol use: No  . Drug use: No    ROS   Objective:   Vitals: BP (!) 143/69 (BP Location: Right Arm)   Pulse 75   Temp 98.7 F (37.1 C)   Resp 17   SpO2 98%   Physical Exam Constitutional:      General: She is not in acute distress.    Appearance: Normal appearance. She is well-developed. She is not ill-appearing, toxic-appearing or diaphoretic.  HENT:     Head: Normocephalic and atraumatic.     Nose: Congestion present. No rhinorrhea.     Mouth/Throat:     Mouth: Mucous membranes are moist.  Eyes:     Extraocular Movements: Extraocular movements intact.  Pupils: Pupils are equal, round, and reactive to light.  Cardiovascular:     Rate and Rhythm: Normal rate and regular rhythm.     Pulses: Normal pulses.     Heart sounds: Normal heart sounds. No murmur heard. No friction rub. No gallop.   Pulmonary:     Effort: Pulmonary effort is normal. No respiratory distress.     Breath sounds: Normal breath sounds. No stridor. No wheezing, rhonchi or rales.  Musculoskeletal:     Comments: Tenderness along the left-sided superior trapezius with multiple spasms extending into the paraspinal muscles of the lower cervical region, thoracic region.  Full range of motion throughout.  Negative Spurling and Lhermitte sign.  Strength 5/5 for upper extremities.  Skin:    General: Skin is warm and dry.     Findings: No rash.   Neurological:     Mental Status: She is alert and oriented to person, place, and time.  Psychiatric:        Mood and Affect: Mood normal.        Behavior: Behavior normal.        Thought Content: Thought content normal.      Assessment and Plan :   I have reviewed the PDMP during this encounter.  1. Trapezius muscle spasm   2. Upper back pain on left side   3. Allergic rhinitis due to other allergic trigger, unspecified seasonality     Recommended patient use naproxen, tizanidine and provided her with several recommendations for massage therapy.  Provided her with a note for her job.  At the end of her visit, patient wanted to discuss allergies.  She states that she has sneezing every day, runny nose for months now.  Does not know what to take for this.  Denies fever, cough, chest pain, shortness of breath.  Recommended Flonase, Zyrtec daily.  Vital signs stable, lung sounds clear.  Low suspicion for COVID-19. Counseled patient on potential for adverse effects with medications prescribed/recommended today, ER and return-to-clinic precautions discussed, patient verbalized understanding.    Jaynee Eagles, [place] 10/07/20 [phone]

## 2020-11-04 NOTE — Addendum Note (Signed)
Addended by: Grayce Sessions on: 11/04/2020 10:18 PM   Modules accepted: Level of Service

## 2020-11-13 ENCOUNTER — Other Ambulatory Visit (INDEPENDENT_AMBULATORY_CARE_PROVIDER_SITE_OTHER): Payer: Self-pay | Admitting: Primary Care

## 2020-11-13 DIAGNOSIS — I10 Essential (primary) hypertension: Secondary | ICD-10-CM

## 2020-11-13 MED ORDER — ATORVASTATIN CALCIUM 20 MG PO TABS: 20.0000 mg | ORAL_TABLET | Freq: Every day | ORAL | 0 refills | Status: DC

## 2020-11-13 MED ORDER — HYDROCHLOROTHIAZIDE 25 MG PO TABS: 25.0000 mg | ORAL_TABLET | Freq: Every day | ORAL | 0 refills | Status: DC

## 2020-11-13 NOTE — Telephone Encounter (Signed)
Medication Refill - Medication: atorvastatin (LIPITOR) 20 MG tablet   hydrochlorothiazide (HYDRODIURIL) 25 MG tablet   Has the patient contacted their pharmacy? Yes.   (Agent: If no, request that the patient contact the pharmacy for the refill.) (Agent: If yes, when and what did the pharmacy advise?)no response from office   Preferred Pharmacy (with phone number or street name):  [facility] [phone] [address], [place] Phone:  [phone]  Fax:  [phone]      Agent: Please be advised that RX refills may take up to 3 business days. We ask that you follow-up with your pharmacy.

## 2020-11-13 NOTE — Telephone Encounter (Signed)
Future visit in 1 month  

## 2020-12-26 ENCOUNTER — Other Ambulatory Visit: Payer: Self-pay

## 2020-12-26 ENCOUNTER — Encounter (HOSPITAL_COMMUNITY): Payer: Self-pay

## 2020-12-26 ENCOUNTER — Ambulatory Visit (HOSPITAL_COMMUNITY)
Admission: EM | Admit: 2020-12-26 | Discharge: 2020-12-26 | Disposition: A | Discharge status: Home / Self Care | Payer: Medicaid Other | Attending: Emergency Medicine | Admitting: Emergency Medicine

## 2020-12-26 DIAGNOSIS — S46812S Strain of other muscles, fascia and tendons at shoulder and upper arm level, left arm, sequela: Secondary | ICD-10-CM | POA: Diagnosis not present

## 2020-12-26 DIAGNOSIS — S46811S Strain of other muscles, fascia and tendons at shoulder and upper arm level, right arm, sequela: Secondary | ICD-10-CM | POA: Diagnosis not present

## 2020-12-26 DIAGNOSIS — S46812A Strain of other muscles, fascia and tendons at shoulder and upper arm level, left arm, initial encounter: Secondary | ICD-10-CM | POA: Diagnosis not present

## 2020-12-26 DIAGNOSIS — S46811A Strain of other muscles, fascia and tendons at shoulder and upper arm level, right arm, initial encounter: Secondary | ICD-10-CM

## 2020-12-26 DIAGNOSIS — J302 Other seasonal allergic rhinitis: Secondary | ICD-10-CM

## 2020-12-26 DIAGNOSIS — R059 Cough, unspecified: Secondary | ICD-10-CM

## 2020-12-26 MED ORDER — NAPROXEN 500 MG PO TABS: 500.0000 mg | ORAL_TABLET | Freq: Two times a day (BID) | ORAL | 0 refills | Status: DC

## 2020-12-26 MED ORDER — TIZANIDINE HCL 4 MG PO TABS: 4.0000 mg | ORAL_TABLET | Freq: Every day | ORAL | 0 refills | Status: DC

## 2020-12-26 MED ORDER — PREDNISONE 20 MG PO TABS: 40.0000 mg | ORAL_TABLET | Freq: Every day | ORAL | 0 refills | Status: DC

## 2020-12-26 MED ORDER — PSEUDOEPHEDRINE-GUAIFENESIN ER 60-600 MG PO TB12: 1.0000 | ORAL_TABLET | Freq: Two times a day (BID) | ORAL | 0 refills | Status: DC

## 2020-12-26 MED ORDER — KETOROLAC TROMETHAMINE 30 MG/ML IJ SOLN
INTRAMUSCULAR | Status: AC
  Filled 2020-12-26: qty 1

## 2020-12-26 MED ORDER — KETOROLAC TROMETHAMINE 30 MG/ML IJ SOLN
30.0000 mg | Freq: Once | INTRAMUSCULAR | Status: AC
  Administered 2020-12-26: 30 mg via INTRAMUSCULAR

## 2020-12-26 MED ORDER — LORATADINE 10 MG PO TABS: 10.0000 mg | ORAL_TABLET | Freq: Every day | ORAL | 1 refills | Status: DC

## 2020-12-26 NOTE — Discharge Instructions (Addendum)
Starting tomorrow take 2 prednisone pills in the morning for the next 5 days  After completion of the prednisone tablets can take naproxen twice a day for 7 days then as needed, take with food to not irritate the stomach  Continue Zanaflex at bedtime as needed for additional comfort  Try to stretch daily to keep muscles loose and can use heating pad in 15-minute intervals over the painful areas  For cough and congestion can take Mucinex every 12 hours  Use Claritin every day at bedtime to help with cough and congestion  If you still have nasal spray, can use twice a day to help with congestion  May follow-up for persistent pain, increased pain, decreased movement, numbness or tingling beginning

## 2020-12-26 NOTE — ED Provider Notes (Addendum)
[place]    CSN: [id number] Arrival date & time: 12/26/20  0805      History   Chief Complaint Chief Complaint  Patient presents with  . Cough    HPI Amber Crawford is a 55 y.o. female.   Patient presents with lower neck pain and bilateral upper back pain for about 3 to 4 weeks.  Describes as pressure.  Denies numbness and tingling.  Worsen with twisting turning and bending.  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  Has had issues with back before.  Has not attempted treatment.  Presents with productive cough and congestion for about 3 weeks.  Cough is exacerbating back pain.  Congestion gets caught in throat, makes her feel like she needs to vomit it up.  Denies fever, chills, headaches, ear pain, sinus pressure, sore throat, shortness of breath, chest pain.  Has not attempted treatment.  Past Medical History:  Diagnosis Date  . Gastritis     There are no problems to display for this patient.   Past Surgical History:  Procedure Laterality Date  . CESAREAN SECTION    . CHOLECYSTECTOMY      OB History    Gravida  5   Para      Term      Preterm      AB  1   Living  4     SAB      IAB  1   Ectopic      Multiple      Live Births               Home Medications    Prior to Admission medications   Medication Sig Start Date End Date Taking? Authorizing Provider  hydrochlorothiazide (HYDRODIURIL) 25 MG tablet Take 1 tablet (25 mg total) by mouth daily. 11/13/20  Yes [name], NP  predniSONE (DELTASONE) 20 MG tablet Take 2 tablets (40 mg total) by mouth daily. 12/26/20  Yes Nakeesha Bowler R, NP  pseudoephedrine-guaifenesin (MUCINEX D) 60-600 MG 12 hr tablet Take 1 tablet by mouth every 12 (twelve) hours. 12/26/20  Yes Kesler Wickham R, NP  albuterol (VENTOLIN HFA) 108 (90 Base) MCG/ACT inhaler Inhale 2 puffs into the lungs every 6 (six) hours as needed for wheezing or shortness of breath.  08/29/20   Rudolpho Sevin, NP  atorvastatin (LIPITOR) 20 MG tablet Take 1 tablet (20 mg total) by mouth daily. 11/13/20   [name], NP  benzonatate (TESSALON PERLES) 100 MG capsule Take 1 capsule (100 mg total) by mouth 3 (three) times daily as needed for cough. 08/29/20 08/29/21  Rudolpho Sevin, NP  Blood Pressure Monitor DEVI 1 kit by Does not apply route 2 (two) times daily as needed. 09/20/19   [name], NP  diclofenac Sodium (VOLTAREN) 1 % GEL Apply 4 g topically 4 (four) times daily. 01/23/20   [name], NP  fluticasone (FLONASE) 50 MCG/ACT nasal spray Place 2 sprays into both nostrils daily. 07/10/20   [name], NP  loratadine (CLARITIN) 10 MG tablet Take 1 tablet (10 mg total) by mouth daily. 12/26/20   Hans Eden, NP  meloxicam (MOBIC) 15 MG tablet Take 0.5-1 tablets (7.5-15 mg total) by mouth daily as needed for pain. 01/31/20   Hilts, Legrand Como, MD  naproxen (NAPROSYN) 500 MG tablet Take 1 tablet (500 mg total) by mouth 2 (two) times daily with  a meal. 12/26/20   Signa Cheek, Leitha Schuller, NP  omeprazole (PRILOSEC) 20 MG capsule Take 1 capsule (20 mg total) by mouth daily. 08/14/20   [name], NP  tiZANidine (ZANAFLEX) 4 MG tablet Take 1 tablet (4 mg total) by mouth at bedtime. 12/26/20   Hans Eden, NP    Family History Family History  Family history unknown: Yes    Social History Social History   Tobacco Use  . Smoking status: Current Some Day Smoker    Packs/day: 0.50    Types: Cigarettes  . Smokeless tobacco: Never Used  Vaping Use  . Vaping Use: Never used  Substance Use Topics  . Alcohol use: No  . Drug use: No     Allergies   Aspirin   Review of Systems Review of Systems  Constitutional: Negative.   HENT: Positive for congestion. Negative for dental problem, drooling, ear discharge, ear pain, facial swelling, hearing loss, mouth sores, nosebleeds, postnasal drip, rhinorrhea, sinus pressure, sinus pain, sneezing, sore  throat, tinnitus, trouble swallowing and voice change.   Respiratory: Negative.   Cardiovascular: Negative.   Genitourinary: Negative.   Musculoskeletal: Positive for back pain. Negative for arthralgias, gait problem, joint swelling, myalgias, neck pain and neck stiffness.  Skin: Negative.   Neurological: Negative.      Physical Exam Triage Vital Signs ED Triage Vitals  Enc Vitals Group     BP 12/26/20 0828 125/66     Pulse Rate 12/26/20 0828 97     Resp 12/26/20 0828 19     Temp 12/26/20 0828 98.3 F (36.8 C)     Temp Source 12/26/20 0828 Oral     SpO2 12/26/20 0828 97 %     Weight --      Height --      Head Circumference --      Peak Flow --      Pain Score 12/26/20 0827 2     Pain Loc --      Pain Edu? --      Excl. in [place]? --    No data found.  Updated Vital Signs BP 125/66 (BP Location: Right Arm)   Pulse 97   Temp 98.3 F (36.8 C) (Oral)   Resp 19   SpO2 97%   Visual Acuity Right Eye Distance:   Left Eye Distance:   Bilateral Distance:    Right Eye Near:   Left Eye Near:    Bilateral Near:     Physical Exam Constitutional:      Appearance: Normal appearance. She is normal weight.  Eyes:     Extraocular Movements: Extraocular movements intact.  Cardiovascular:     Rate and Rhythm: Normal rate and regular rhythm.     Pulses: Normal pulses.     Heart sounds: Normal heart sounds.  Pulmonary:     Effort: Pulmonary effort is normal.     Breath sounds: Normal breath sounds.  Musculoskeletal:     Cervical back: Normal range of motion and neck supple.     Lumbar back: Tenderness present. No swelling or edema. Normal range of motion.       Back:     Comments: Bilateral tenderness over trapezius muscles, tenderness radiating down lateral latissimus dorsi  Skin:    General: Skin is warm and dry.  Neurological:     General: No focal deficit present.     Mental Status: She is alert and oriented to person, place, and time. Mental status is at baseline.  Psychiatric:        Mood and Affect: Mood normal.        Behavior: Behavior normal.        Thought Content: Thought content normal.        Judgment: Judgment normal.      UC Treatments / Results  Labs (all labs ordered are listed, but only abnormal results are displayed) Labs Reviewed - No data to display  EKG   Radiology No results found.  Procedures Procedures (including critical care time)  Medications Ordered in UC Medications  ketorolac (TORADOL) 30 MG/ML injection 30 mg (30 mg Intramuscular Given 12/26/20 0855)    Initial Impression / Assessment and Plan / UC Course  I have reviewed the triage vital signs and the nursing notes.  Pertinent labs & imaging results that were available during my care of the patient were reviewed by me and considered in my medical decision making (see chart for details).  Bilateral trapezius muscle strain Cough  1.  Prednisone 40 mg daily for 5 days 2.  After completion of prednisone naproxen twice daily for 5 to 7 days then as needed with food to prevent stomach irritation 3.  Zanaflex 4 mg at bedtime as needed 4.  Encourage daily stretching and self massage of muscles. 5.  Claritin 10 mg daily at bedtime 6.  Mucinex D 60-600 every 12 hours as needed 7.  Toradol 30 mg once 8.  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 understanding Final Clinical Impressions(s) / UC Diagnoses   Final diagnoses:  Cough  Trapezius muscle strain, left, sequela  Trapezius muscle strain, right, sequela     Discharge Instructions     Starting tomorrow take 2 prednisone pills in the morning for the next 5 days  After completion of the prednisone tablets can take naproxen twice a day for 7 days then as needed, take with food to not irritate the  stomach  Continue Zanaflex at bedtime as needed for additional comfort  Try to stretch daily to keep muscles loose and can use heating pad in 15-minute intervals over the painful areas  For cough and congestion can take Mucinex every 12 hours  Use Claritin every day at bedtime to help with cough and congestion  If you still have nasal spray, can use twice a day to help with congestion  May follow-up for persistent pain, increased pain, decreased movement, numbness or tingling beginning   ED Prescriptions    Medication Sig Dispense Auth. Provider   predniSONE (DELTASONE) 20 MG tablet Take 2 tablets (40 mg total) by mouth daily. 10 tablet Anushri Casalino R, NP   tiZANidine (ZANAFLEX) 4 MG tablet Take 1 tablet (4 mg total) by mouth at bedtime. 30 tablet Sharanya Templin R, NP   naproxen (NAPROSYN) 500 MG tablet Take 1 tablet (500 mg total) by mouth 2 (two) times daily with a meal. 30 tablet Kyli Sorter R, NP   pseudoephedrine-guaifenesin (MUCINEX D) 60-600 MG 12 hr tablet Take 1 tablet by mouth every 12 (twelve) hours. 30 tablet Azarias Chiou R, NP   loratadine (CLARITIN) 10 MG tablet Take 1 tablet (10 mg total) by mouth daily. 90 tablet Dalyn Kjos, Leitha Schuller, NP     PDMP not reviewed this encounter.   Hans Eden, [place] 12/26/20 [phone]  Hans Eden, [place] 12/26/20 [phone]

## 2020-12-26 NOTE — ED Triage Notes (Signed)
Pt c/o pain that starts at the back of the neck to the back. She states she has been coughing and chest congestion X 3 weeks.

## 2021-01-06 ENCOUNTER — Encounter (HOSPITAL_COMMUNITY): Payer: Self-pay

## 2021-01-06 ENCOUNTER — Ambulatory Visit (HOSPITAL_COMMUNITY)
Admission: EM | Admit: 2021-01-06 | Discharge: 2021-01-06 | Disposition: A | Discharge status: Home / Self Care | Payer: Medicaid Other | Attending: Student | Admitting: Student

## 2021-01-06 ENCOUNTER — Other Ambulatory Visit: Payer: Self-pay

## 2021-01-06 DIAGNOSIS — I10 Essential (primary) hypertension: Secondary | ICD-10-CM

## 2021-01-06 DIAGNOSIS — Z789 Other specified health status: Secondary | ICD-10-CM | POA: Diagnosis not present

## 2021-01-06 DIAGNOSIS — G5601 Carpal tunnel syndrome, right upper limb: Secondary | ICD-10-CM | POA: Diagnosis not present

## 2021-01-06 MED ORDER — IBUPROFEN 800 MG PO TABS: 800.0000 mg | ORAL_TABLET | Freq: Three times a day (TID) | ORAL | 0 refills | Status: DC
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
## 2021-01-06 NOTE — Discharge Instructions (Signed)
-  Take Tylenol 1000 mg 3 times daily, and ibuprofen 800 mg 3 times daily with food.  You can take these together, or alternate every 3-4 hours. -Wrist brace for discomfort. Wear this as long as discomfort persists.  -Avoid doing things that cause the pain.  You will probably have to take it easy at work. -Follow-up with orthopedist if symptoms persist in about 5 days.  Information below. -Continue your current blood pressure medications.

## 2021-01-06 NOTE — ED Triage Notes (Signed)
Pt in with c/o right hand/ wrist pain x 2 weeks.  Pt states she has carpal tunnel in her hand

## 2021-01-07 ENCOUNTER — Telehealth (INDEPENDENT_AMBULATORY_CARE_PROVIDER_SITE_OTHER): Payer: Medicaid Other | Admitting: Primary Care

## 2021-01-28 ENCOUNTER — Other Ambulatory Visit: Payer: Self-pay

## 2021-01-28 ENCOUNTER — Ambulatory Visit (INDEPENDENT_AMBULATORY_CARE_PROVIDER_SITE_OTHER): Payer: Self-pay | Admitting: *Deleted

## 2021-01-28 ENCOUNTER — Encounter (HOSPITAL_COMMUNITY): Payer: Self-pay | Admitting: Urgent Care

## 2021-01-28 ENCOUNTER — Ambulatory Visit (HOSPITAL_COMMUNITY)
Admission: EM | Admit: 2021-01-28 | Discharge: 2021-01-28 | Disposition: A | Discharge status: Home / Self Care | Payer: Medicaid Other | Attending: Urgent Care | Admitting: Urgent Care

## 2021-01-28 DIAGNOSIS — F17200 Nicotine dependence, unspecified, uncomplicated: Secondary | ICD-10-CM

## 2021-01-28 DIAGNOSIS — R059 Cough, unspecified: Secondary | ICD-10-CM | POA: Insufficient documentation

## 2021-01-28 DIAGNOSIS — Z8616 Personal history of COVID-19: Secondary | ICD-10-CM | POA: Insufficient documentation

## 2021-01-28 DIAGNOSIS — Z886 Allergy status to analgesic agent status: Secondary | ICD-10-CM | POA: Diagnosis not present

## 2021-01-28 DIAGNOSIS — Z20822 Contact with and (suspected) exposure to covid-19: Secondary | ICD-10-CM | POA: Insufficient documentation

## 2021-01-28 DIAGNOSIS — R0981 Nasal congestion: Secondary | ICD-10-CM | POA: Diagnosis not present

## 2021-01-28 DIAGNOSIS — Z791 Long term (current) use of non-steroidal anti-inflammatories (NSAID): Secondary | ICD-10-CM | POA: Diagnosis not present

## 2021-01-28 DIAGNOSIS — R0982 Postnasal drip: Secondary | ICD-10-CM | POA: Insufficient documentation

## 2021-01-28 DIAGNOSIS — R42 Dizziness and giddiness: Secondary | ICD-10-CM | POA: Insufficient documentation

## 2021-01-28 DIAGNOSIS — Z79899 Other long term (current) drug therapy: Secondary | ICD-10-CM | POA: Diagnosis not present

## 2021-01-28 DIAGNOSIS — F17210 Nicotine dependence, cigarettes, uncomplicated: Secondary | ICD-10-CM | POA: Insufficient documentation

## 2021-01-28 DIAGNOSIS — J3089 Other allergic rhinitis: Secondary | ICD-10-CM

## 2021-01-28 DIAGNOSIS — J309 Allergic rhinitis, unspecified: Secondary | ICD-10-CM | POA: Insufficient documentation

## 2021-01-28 MED ORDER — PSEUDOEPHEDRINE HCL 60 MG PO TABS: 60.0000 mg | ORAL_TABLET | Freq: Three times a day (TID) | ORAL | 0 refills | Status: DC | PRN

## 2021-01-28 MED ORDER — CETIRIZINE HCL 10 MG PO TABS: 10.0000 mg | ORAL_TABLET | Freq: Every day | ORAL | 0 refills | Status: DC

## 2021-01-28 MED ORDER — FLUTICASONE PROPIONATE 50 MCG/ACT NA SUSP: 2.0000 | Freq: Every day | NASAL | 0 refills | Status: DC

## 2021-01-28 NOTE — Telephone Encounter (Signed)
Assisted by Interpreter Byrd Hesselbach  [phone] Reports dizziness x 2 weeks. States positional, "Turning head, lying down and getting up, turning around." States "Not spinning, but like I'm drunk." States had symptoms, vertigo, years ago and prescribed Antivert. Also reports BP elevated, but no values. Denies headache. Reports increased fatigue. No availability in protocol timeframe of 3 days, directed to UC. States will follow disposition. Assured pt NT would route to practice for PCPs review. Are advise given, verbalizes understanding.   Reason for Disposition . [1] MODERATE dizziness (e.g., interferes with normal activities) AND [2] has been evaluated by physician for this  Answer Assessment - Initial Assessment Questions 1. DESCRIPTION: "Describe your dizziness."     Dizzy turing head 2. LIGHTHEADED: "Do you feel lightheaded?" (e.g., somewhat faint, woozy, weak upon standing)     yes 3. VERTIGO: "Do you feel like either you or the room is spinning or tilting?" (i.e. vertigo)     No but like I'm drunk 4. SEVERITY: "How bad is it?"  "Do you feel like you are going to faint?" "Can you stand and walk?"   - MILD: Feels slightly dizzy, but walking normally.   - MODERATE: Feels unsteady when walking, but not falling; interferes with normal activities (e.g., school, work).   - SEVERE: Unable to walk without falling, or requires assistance to walk without falling; feels like passing out now.      *Moderate 5. ONSET:  "When did the dizziness begin?"     2 weeks ago 6. AGGRAVATING FACTORS: "Does anything make it worse?" (e.g., standing, change in head position)     Turning head, sitting to standing, moving fast 7. HEART RATE: "Can you tell me your heart rate?" "How many beats in 15 seconds?"  (Note: not all patients can do this)       no 8. CAUSE: "What do you think is causing the dizziness?"     *Maybe BP, nerves 9. RECURRENT SYMPTOM: "Have you had dizziness before?" If Yes, ask: "When was the last time?"  "What happened that time?"     Yes, vertigo 10. OTHER SYMPTOMS: "Do you have any other symptoms?" (e.g., fever, chest pain, vomiting, diarrhea, bleeding)      Increased fatigue  Protocols used: DIZZINESS [facility]

## 2021-01-28 NOTE — ED Provider Notes (Signed)
[place]   MRN: [id number] DOB: 05-Sep-1965  Subjective:   Amber Crawford is a 55 y.o. female presenting for 2 week history of persistent sinus congestion, runny nose, cough, fatigue, "swimmy head", dizziness. Denies fever, chest pain, shob. Smokes 1/2ppd. Had COVID 19 last year. Has her vaccination but not the booster. Has regular follow up with her PCP. Denies history of respiratory disorders, thyroid disease, diabetes. Denies history of CAD, stroke. Takes blood pressure medication and is managing this well.   No current facility-administered medications for this encounter.  Current Outpatient Medications:  .  albuterol (VENTOLIN HFA) 108 (90 Base) MCG/ACT inhaler, Inhale 2 puffs into the lungs every 6 (six) hours as needed for wheezing or shortness of breath., Disp: 8 g, Rfl: 0 .  atorvastatin (LIPITOR) 20 MG tablet, Take 1 tablet (20 mg total) by mouth daily., Disp: 90 tablet, Rfl: 0 .  benzonatate (TESSALON PERLES) 100 MG capsule, Take 1 capsule (100 mg total) by mouth 3 (three) times daily as needed for cough., Disp: 30 capsule, Rfl: 0 .  Blood Pressure Monitor DEVI, 1 kit by Does not apply route 2 (two) times daily as needed., Disp: 1 each, Rfl: bag .  diclofenac Sodium (VOLTAREN) 1 % GEL, Apply 4 g topically 4 (four) times daily., Disp: 350 g, Rfl: 1 .  fluticasone (FLONASE) 50 MCG/ACT nasal spray, Place 2 sprays into both nostrils daily., Disp: 16 g, Rfl: 6 .  hydrochlorothiazide (HYDRODIURIL) 25 MG tablet, Take 1 tablet (25 mg total) by mouth daily., Disp: 90 tablet, Rfl: 0 .  ibuprofen (ADVIL) 800 MG tablet, Take 1 tablet (800 mg total) by mouth 3 (three) times daily., Disp: 21 tablet, Rfl: 0 .  loratadine (CLARITIN) 10 MG tablet, Take 1 tablet (10 mg total) by mouth daily., Disp: 90 tablet, Rfl: 1 .  meloxicam (MOBIC) 15 MG tablet, Take 0.5-1 tablets (7.5-15 mg total) by mouth daily as needed for pain., Disp: 30 tablet, Rfl: 6 .  naproxen (NAPROSYN)  500 MG tablet, Take 1 tablet (500 mg total) by mouth 2 (two) times daily with a meal., Disp: 30 tablet, Rfl: 0 .  omeprazole (PRILOSEC) 20 MG capsule, Take 1 capsule (20 mg total) by mouth daily., Disp: 30 capsule, Rfl: 3 .  predniSONE (DELTASONE) 20 MG tablet, Take 2 tablets (40 mg total) by mouth daily., Disp: 10 tablet, Rfl: 0 .  pseudoephedrine-guaifenesin (MUCINEX D) 60-600 MG 12 hr tablet, Take 1 tablet by mouth every 12 (twelve) hours., Disp: 30 tablet, Rfl: 0 .  tiZANidine (ZANAFLEX) 4 MG tablet, Take 1 tablet (4 mg total) by mouth at bedtime., Disp: 30 tablet, Rfl: 0   Allergies  Allergen Reactions  . Aspirin Other (See Comments)    Fingers get numb; OK to take acetaminophen and IBU    Past Medical History:  Diagnosis Date  . Gastritis      Past Surgical History:  Procedure Laterality Date  . CESAREAN SECTION    . CHOLECYSTECTOMY      Family History  Family history unknown: Yes    Social History   Tobacco Use  . Smoking status: Current Some Day Smoker    Packs/day: 0.50    Types: Cigarettes  . Smokeless tobacco: Never Used  Vaping Use  . Vaping Use: Never used  Substance Use Topics  . Alcohol use: No  . Drug use: No    ROS   Objective:   Vitals: BP 118/76 (BP Location: Right Arm)  Pulse 76   Temp 98 F (36.7 C)   Resp 16   LMP 01/28/2021 (Exact Date) Comment: Pt started her MS today.   SpO2 96%   Physical Exam Constitutional:      General: She is not in acute distress.    Appearance: Normal appearance. She is well-developed. She is not ill-appearing, toxic-appearing or diaphoretic.  HENT:     Head: Normocephalic and atraumatic.     Right Ear: Tympanic membrane, ear canal and external ear normal. No drainage or tenderness. No middle ear effusion. There is no impacted cerumen. Tympanic membrane is not erythematous.     Left Ear: Tympanic membrane, ear canal and external ear normal. No drainage or tenderness.  No middle ear effusion. There is no  impacted cerumen. Tympanic membrane is not erythematous.     Nose: Congestion present. No rhinorrhea.     Comments: Nasal mucosa boggy and edematous.     Mouth/Throat:     Mouth: Mucous membranes are moist. No oral lesions.     Pharynx: No pharyngeal swelling, oropharyngeal exudate, posterior oropharyngeal erythema or uvula swelling.     Tonsils: No tonsillar exudate or tonsillar abscesses.  Eyes:     General: No scleral icterus.       Right eye: No discharge.        Left eye: No discharge.     Extraocular Movements: Extraocular movements intact.     Right eye: Normal extraocular motion.     Left eye: Normal extraocular motion.     Conjunctiva/sclera: Conjunctivae normal.     Pupils: Pupils are equal, round, and reactive to light.     Comments: No nystagmus.  Cardiovascular:     Rate and Rhythm: Normal rate and regular rhythm.     Pulses: Normal pulses.     Heart sounds: Normal heart sounds. No murmur heard. No friction rub. No gallop.   Pulmonary:     Effort: Pulmonary effort is normal. No respiratory distress.     Breath sounds: Normal breath sounds. No stridor. No wheezing, rhonchi or rales.  Musculoskeletal:     Cervical back: Normal range of motion and neck supple.  Lymphadenopathy:     Cervical: No cervical adenopathy.  Skin:    General: Skin is warm and dry.     Findings: No rash.  Neurological:     General: No focal deficit present.     Mental Status: She is alert and oriented to person, place, and time.     Cranial Nerves: No cranial nerve deficit.     Motor: No weakness.     Coordination: Coordination normal.     Gait: Gait normal.     Deep Tendon Reflexes: Reflexes normal.  Psychiatric:        Mood and Affect: Mood normal.        Behavior: Behavior normal.        Thought Content: Thought content normal.        Judgment: Judgment normal.      Assessment and Plan :   PDMP not reviewed this encounter.  1. Allergic rhinitis due to other allergic trigger,  unspecified seasonality   2. Dizziness   3. Nasal congestion   4. Post-nasal drainage   5. Coughing   6. Smoker     Patient has a history of allergies but is not taking any allergy medications. Recommended starting Flonase, Zyrtec, Sudafed (prn). Encouraged smoking cessation. COVID 19 testing pending. Counseled patient on potential for adverse effects with medications  prescribed/recommended today, ER and return-to-clinic precautions discussed, patient verbalized understanding.    Jaynee Eagles, PA-C 01/28/21 1429

## 2021-01-28 NOTE — ED Triage Notes (Signed)
Pt c/o dizziness when driving and moving her head X 2 weeks.She states she feels fatigue all the time.

## 2021-01-29 LAB — SARS CORONAVIRUS 2 (TAT 6-24 HRS): SARS Coronavirus 2: NEGATIVE

## 2021-02-03 ENCOUNTER — Other Ambulatory Visit (INDEPENDENT_AMBULATORY_CARE_PROVIDER_SITE_OTHER): Payer: Self-pay | Admitting: Primary Care

## 2021-03-06 ENCOUNTER — Telehealth: Payer: Self-pay

## 2021-03-06 NOTE — Telephone Encounter (Signed)
Lmom for patient to call back if interested in having mammogram done during one of the upcoming mobile mammogram events held on 04/16/2021 and 05/14/2021.

## 2021-04-14 ENCOUNTER — Ambulatory Visit (HOSPITAL_COMMUNITY)
Admission: EM | Admit: 2021-04-14 | Discharge: 2021-04-14 | Disposition: A | Discharge status: Home / Self Care | Payer: Medicaid Other | Attending: Physician Assistant | Admitting: Physician Assistant

## 2021-04-14 ENCOUNTER — Other Ambulatory Visit: Payer: Self-pay

## 2021-04-14 ENCOUNTER — Encounter (HOSPITAL_COMMUNITY): Payer: Self-pay | Admitting: Emergency Medicine

## 2021-04-14 DIAGNOSIS — R42 Dizziness and giddiness: Secondary | ICD-10-CM

## 2021-04-14 DIAGNOSIS — R110 Nausea: Secondary | ICD-10-CM

## 2021-04-14 LAB — POCT URINALYSIS DIPSTICK, ED / UC
Bilirubin Urine: NEGATIVE
Glucose, UA: NEGATIVE mg/dL
Ketones, ur: NEGATIVE mg/dL
Leukocytes,Ua: NEGATIVE
Nitrite: NEGATIVE
Protein, ur: NEGATIVE mg/dL
Specific Gravity, Urine: 1.015 (ref 1.005–1.030)
Urobilinogen, UA: 0.2 mg/dL (ref 0.0–1.0)
pH: 7 (ref 5.0–8.0)

## 2021-04-14 LAB — CBG MONITORING, ED: Glucose-Capillary: 97 mg/dL (ref 70–99)

## 2021-04-14 MED ORDER — MECLIZINE HCL 25 MG PO TABS: 25.0000 mg | ORAL_TABLET | Freq: Three times a day (TID) | ORAL | 0 refills | Status: DC | PRN

## 2021-04-14 NOTE — ED Provider Notes (Signed)
[place]    CSN: [id number] Arrival date & time: 04/14/21  0857      History   Chief Complaint Chief Complaint  Patient presents with   Dizziness    HPI Amber Crawford is a 55 y.o. female.   Patient presents today with a several month history of intermittent dizziness.  She is Spanish-speaking and video interpreter is utilized during this visit.  She describes this as a room spinning sensation as though she does not have her balance.  This is worse when she changes position or moves and last for few seconds before resolving without intervention.  She denies any chest pain, shortness of breath, vision changes, headache, focal weakness, dysarthria.  She was given a medication that provided temporary relief of symptoms.  She has not seen an ENT in the past.  Denies any recent illness or additional symptoms including cough, congestion, fever, otalgia.  She denies any change in her hearing.  Denies any recent medication changes or head injury prior to symptom onset.  She does report symptoms are worse at work as she is a physically demanding job and often develops significant tension in her neck that seems to exacerbate symptoms.  She does report onset of symptoms began soon after having COVID several months ago and have been intermittent since that time.  She has not identified any specific triggers.   Past Medical History:  Diagnosis Date   Gastritis     There are no problems to display for this patient.   Past Surgical History:  Procedure Laterality Date   CESAREAN SECTION     CHOLECYSTECTOMY      OB History     Gravida  5   Para      Term      Preterm      AB  1   Living  4      SAB      IAB  1   Ectopic      Multiple      Live Births               Home Medications    Prior to Admission medications   Medication Sig Start Date End Date Taking? Authorizing Provider  atorvastatin (LIPITOR) 20 MG tablet TAKE 1 TABLET BY MOUTH  EVERY DAY 02/03/21  Yes [name], NP  hydrochlorothiazide (HYDRODIURIL) 25 MG tablet Take 1 tablet (25 mg total) by mouth daily. 11/13/20  Yes [name], NP  meclizine (ANTIVERT) 25 MG tablet Take 1 tablet (25 mg total) by mouth 3 (three) times daily as needed for dizziness. 04/14/21  Yes Allexis Bordenave K, PA-C  albuterol (VENTOLIN HFA) 108 (90 Base) MCG/ACT inhaler Inhale 2 puffs into the lungs every 6 (six) hours as needed for wheezing or shortness of breath. 08/29/20   Rudolpho Sevin, NP  benzonatate (TESSALON PERLES) 100 MG capsule Take 1 capsule (100 mg total) by mouth 3 (three) times daily as needed for cough. Patient not taking: Reported on 04/14/2021 08/29/20 08/29/21  Rudolpho Sevin, NP  Blood Pressure Monitor DEVI 1 kit by Does not apply route 2 (two) times daily as needed. 09/20/19   [name], NP  cetirizine (ZYRTEC ALLERGY) 10 MG tablet Take 1 tablet (10 mg total) by mouth daily. 01/28/21   Jaynee Eagles, PA-C  diclofenac Sodium (VOLTAREN) 1 % GEL Apply 4 g topically 4 (four) times daily. 01/23/20   [name], NP  fluticasone Asencion Islam)  50 MCG/ACT nasal spray Place 2 sprays into both nostrils daily. Patient not taking: Reported on 04/14/2021 01/28/21   Jaynee Eagles, PA-C  ibuprofen (ADVIL) 800 MG tablet Take 1 tablet (800 mg total) by mouth 3 (three) times daily. 01/06/21   Hazel Sams, PA-C  loratadine (CLARITIN) 10 MG tablet Take 1 tablet (10 mg total) by mouth daily. 12/26/20   Hans Eden, NP  meloxicam (MOBIC) 15 MG tablet Take 0.5-1 tablets (7.5-15 mg total) by mouth daily as needed for pain. 01/31/20   Hilts, Legrand Como, MD  naproxen (NAPROSYN) 500 MG tablet Take 1 tablet (500 mg total) by mouth 2 (two) times daily with a meal. 12/26/20   White, Leitha Schuller, NP  omeprazole (PRILOSEC) 20 MG capsule Take 1 capsule (20 mg total) by mouth daily. Patient not taking: Reported on 04/14/2021 08/14/20   [name], NP  pseudoephedrine (SUDAFED) 60 MG tablet Take 1  tablet (60 mg total) by mouth every 8 (eight) hours as needed for congestion. Patient not taking: Reported on 04/14/2021 01/28/21   Jaynee Eagles, PA-C  tiZANidine (ZANAFLEX) 4 MG tablet Take 1 tablet (4 mg total) by mouth at bedtime. Patient not taking: Reported on 04/14/2021 12/26/20   Hans Eden, NP    Family History Family History  Family history unknown: Yes    Social History Social History   Tobacco Use   Smoking status: Some Days    Packs/day: 0.50    Types: Cigarettes   Smokeless tobacco: Never  Vaping Use   Vaping Use: Never used  Substance Use Topics   Alcohol use: No   Drug use: No     Allergies   Aspirin   Review of Systems Review of Systems  Constitutional:  Positive for activity change. Negative for appetite change, fatigue and fever.  HENT:  Negative for congestion, hearing loss, sinus pressure, sneezing and sore throat.   Respiratory:  Negative for cough and shortness of breath.   Cardiovascular:  Negative for chest pain.  Gastrointestinal:  Positive for nausea. Negative for abdominal pain, diarrhea and vomiting.  Musculoskeletal:  Negative for arthralgias and myalgias.  Neurological:  Positive for dizziness. Negative for syncope, facial asymmetry, speech difficulty, weakness, light-headedness, numbness and headaches.    Physical Exam Triage Vital Signs ED Triage Vitals  Enc Vitals Group     BP 04/14/21 1002 120/71     Pulse Rate 04/14/21 1002 70     Resp 04/14/21 1002 16     Temp 04/14/21 1002 98.3 F (36.8 C)     Temp Source 04/14/21 1002 Oral     SpO2 04/14/21 1002 99 %     Weight --      Height --      Head Circumference --      Peak Flow --      Pain Score 04/14/21 1008 0     Pain Loc --      Pain Edu? --      Excl. in [place]? --    No data found.  Updated Vital Signs BP 120/71 (BP Location: Right Arm)   Pulse 70   Temp 98.3 F (36.8 C) (Oral)   Resp 16   SpO2 99%   Visual Acuity Right Eye Distance:   Left Eye Distance:    Bilateral Distance:    Right Eye Near:   Left Eye Near:    Bilateral Near:     Physical Exam Vitals reviewed.  Constitutional:  General: She is awake. She is not in acute distress.    Appearance: Normal appearance. She is not ill-appearing.  HENT:     Head: Normocephalic and atraumatic. No raccoon eyes, Battle's sign or contusion.     Right Ear: Tympanic membrane, ear canal and external ear normal. No hemotympanum.     Left Ear: Tympanic membrane, ear canal and external ear normal. No hemotympanum.     Nose: Nose normal.     Mouth/Throat:     Tongue: Tongue does not deviate from midline.     Pharynx: Uvula midline. No oropharyngeal exudate or posterior oropharyngeal erythema.  Eyes:     Extraocular Movements: Extraocular movements intact.     Conjunctiva/sclera: Conjunctivae normal.     Pupils: Pupils are equal, round, and reactive to light.  Cardiovascular:     Rate and Rhythm: Normal rate and regular rhythm.     Heart sounds: Normal heart sounds, S1 normal and S2 normal. No murmur heard. Pulmonary:     Effort: Pulmonary effort is normal.     Breath sounds: Normal breath sounds. No wheezing, rhonchi or rales.     Comments: Clear to auscultation bilaterally Abdominal:     Palpations: Abdomen is soft.     Tenderness: There is no abdominal tenderness.  Musculoskeletal:     Cervical back: Normal range of motion and neck supple. No spinous process tenderness or muscular tenderness.     Comments: Strength 5/5 bilateral upper and lower extremities  Lymphadenopathy:     Head:     Right side of head: No submental, submandibular or tonsillar adenopathy.     Left side of head: No submental, submandibular or tonsillar adenopathy.  Neurological:     General: No focal deficit present.     Cranial Nerves: Cranial nerves are intact.     Motor: Motor function is intact.     Coordination: Coordination is intact.     Gait: Gait is intact.     Comments: Cranial nerves II through XII  intact.  Psychiatric:        Behavior: Behavior is cooperative.     UC Treatments / Results  Labs (all labs ordered are listed, but only abnormal results are displayed) Labs Reviewed  POCT URINALYSIS DIPSTICK, ED / UC - Abnormal; Notable for the following components:      Result Value   Hgb urine dipstick SMALL (*)    All other components within normal limits  CBG MONITORING, ED    EKG   Radiology No results found.  Procedures Procedures (including critical care time)  Medications Ordered in UC Medications - No data to display  Initial Impression / Assessment and Plan / UC Course  I have reviewed the triage vital signs and the nursing notes.  Pertinent labs & imaging results that were available during my care of the patient were reviewed by me and considered in my medical decision making (see chart for details).      Blood sugar was normal.  UA showed no evidence of dehydration but did show trace hemoglobin.  EKG obtained showed normal sinus rhythm with a ventricular rate of 66 bpm with nonspecific ST changes in lead III and aVL and later R wave progression compared to 07/27/2017 tracing.  Given clinical presentation suspect BPPV as etiology of symptoms.  Patient was started on meclizine to be used up to 3 times a day as needed.  Encouraged her to eat small frequent meals and drink plenty of fluid.  Discussed that  if symptoms or not improving he should follow-up with ENT and was given contact information for local ENT provider and encouraged to call them to schedule appointment if necessary.  Discussed alarm symptoms that warrant emergent evaluation.  Strict return precautions given to which patient expressed understanding.  Final Clinical Impressions(s) / UC Diagnoses   Final diagnoses:  Vertigo  Dizziness  Nausea without vomiting     Discharge Instructions      Your work-up was normal with no evidence of significant dehydration, EKG abnormalities, low blood sugar.   I think you have vertigo as we discussed.  Please take meclizine 3 times a day.  It is important that you drink plenty of fluid.  If your symptoms or not improving I recommend you follow-up with an ear nose and throat doctor for further testing as we discussed.  If you have any worsening symptoms including persistent dizziness, hearing loss, headache, weakness you need to be seen immediately.     ED Prescriptions     Medication Sig Dispense Auth. Provider   meclizine (ANTIVERT) 25 MG tablet Take 1 tablet (25 mg total) by mouth 3 (three) times daily as needed for dizziness. 30 tablet Ahleah Simko, Derry Skill, PA-C      PDMP not reviewed this encounter.   Terrilee Croak, PA-C 04/14/21 1221

## 2021-04-14 NOTE — Discharge Instructions (Signed)
Your work-up was normal with no evidence of significant dehydration, EKG abnormalities, low blood sugar.  I think you have vertigo as we discussed.  Please take meclizine 3 times a day.  It is important that you drink plenty of fluid.  If your symptoms or not improving I recommend you follow-up with an ear nose and throat doctor for further testing as we discussed.  If you have any worsening symptoms including persistent dizziness, hearing loss, headache, weakness you need to be seen immediately.

## 2021-04-14 NOTE — ED Triage Notes (Signed)
Complains of dizziness, "drunken feeling", also feels nausea during these times.  Patient notices tingling feeling in lower extremities when crossing legs.  Denies chest pain  Has noticed these symptoms for 3 months .  Symptoms are intermittent.  Saw a provider and was given medicine for vertigo.  Symptoms did go away, but have returned.    Patient reports having a lot of stress, anxiety and feeling nervous

## 2021-04-24 ENCOUNTER — Other Ambulatory Visit (INDEPENDENT_AMBULATORY_CARE_PROVIDER_SITE_OTHER): Payer: Medicaid Other

## 2021-04-24 DIAGNOSIS — Z1152 Encounter for screening for COVID-19: Secondary | ICD-10-CM | POA: Diagnosis not present

## 2021-04-24 LAB — POC COVID19 BINAXNOW: SARS Coronavirus 2 Ag: NEGATIVE

## 2021-04-24 NOTE — Progress Notes (Signed)
Patient visit after suspecting daughter of having covid-19. Patient and daughters Covid-19 test were negative. Patient was notified of lab results

## 2021-05-13 ENCOUNTER — Other Ambulatory Visit (INDEPENDENT_AMBULATORY_CARE_PROVIDER_SITE_OTHER): Payer: Self-pay | Admitting: Primary Care

## 2021-05-13 NOTE — Telephone Encounter (Signed)
Sent to PCP to refill if appropriate.  

## 2021-05-13 NOTE — Telephone Encounter (Signed)
Lipids were normal last done. D/C medication and  Healthy lifestyle diet of fruits vegetables fish nuts whole grains and low saturated fat . Foods high in cholesterol or liver, fatty meats,cheese, butter avocados, nuts and seeds, chocolate and fried foods. Repeat labs on next f/u visit

## 2021-05-26 ENCOUNTER — Other Ambulatory Visit (INDEPENDENT_AMBULATORY_CARE_PROVIDER_SITE_OTHER): Payer: Self-pay | Admitting: Primary Care

## 2021-05-26 DIAGNOSIS — I10 Essential (primary) hypertension: Secondary | ICD-10-CM

## 2021-05-26 NOTE — Telephone Encounter (Signed)
Sent to PCP ?

## 2021-06-11 ENCOUNTER — Other Ambulatory Visit (INDEPENDENT_AMBULATORY_CARE_PROVIDER_SITE_OTHER): Payer: Self-pay | Admitting: Primary Care

## 2021-06-11 NOTE — Telephone Encounter (Signed)
Sent to PCP. Last lipid was normal in 11/21

## 2021-07-29 ENCOUNTER — Encounter (HOSPITAL_COMMUNITY): Payer: Self-pay

## 2021-07-29 ENCOUNTER — Ambulatory Visit (HOSPITAL_COMMUNITY)
Admission: EM | Admit: 2021-07-29 | Discharge: 2021-07-29 | Disposition: A | Discharge status: Home / Self Care | Payer: Medicaid Other | Attending: Urgent Care | Admitting: Urgent Care

## 2021-07-29 ENCOUNTER — Other Ambulatory Visit: Payer: Self-pay

## 2021-07-29 DIAGNOSIS — J309 Allergic rhinitis, unspecified: Secondary | ICD-10-CM | POA: Diagnosis not present

## 2021-07-29 DIAGNOSIS — R52 Pain, unspecified: Secondary | ICD-10-CM

## 2021-07-29 DIAGNOSIS — R052 Subacute cough: Secondary | ICD-10-CM

## 2021-07-29 DIAGNOSIS — Z79899 Other long term (current) drug therapy: Secondary | ICD-10-CM | POA: Diagnosis not present

## 2021-07-29 DIAGNOSIS — U071 COVID-19: Secondary | ICD-10-CM | POA: Insufficient documentation

## 2021-07-29 DIAGNOSIS — F17200 Nicotine dependence, unspecified, uncomplicated: Secondary | ICD-10-CM

## 2021-07-29 DIAGNOSIS — J3089 Other allergic rhinitis: Secondary | ICD-10-CM | POA: Diagnosis not present

## 2021-07-29 DIAGNOSIS — F17210 Nicotine dependence, cigarettes, uncomplicated: Secondary | ICD-10-CM | POA: Insufficient documentation

## 2021-07-29 DIAGNOSIS — B349 Viral infection, unspecified: Secondary | ICD-10-CM | POA: Diagnosis present

## 2021-07-29 DIAGNOSIS — R519 Headache, unspecified: Secondary | ICD-10-CM

## 2021-07-29 LAB — RESPIRATORY PANEL BY PCR

## 2021-07-29 MED ORDER — ALBUTEROL SULFATE HFA 108 (90 BASE) MCG/ACT IN AERS: 2.0000 | INHALATION_SPRAY | Freq: Four times a day (QID) | RESPIRATORY_TRACT | 0 refills | Status: DC | PRN

## 2021-07-29 MED ORDER — PSEUDOEPHEDRINE HCL 60 MG PO TABS: 60.0000 mg | ORAL_TABLET | Freq: Three times a day (TID) | ORAL | 0 refills | Status: DC | PRN

## 2021-07-29 MED ORDER — CETIRIZINE HCL 10 MG PO TABS: 10.0000 mg | ORAL_TABLET | Freq: Every day | ORAL | 0 refills | Status: DC

## 2021-07-29 MED ORDER — PROMETHAZINE-DM 6.25-15 MG/5ML PO SYRP: 5.0000 mL | ORAL_SOLUTION | Freq: Every evening | ORAL | 0 refills | Status: DC | PRN

## 2021-07-29 MED ORDER — BENZONATATE 100 MG PO CAPS: 100.0000 mg | ORAL_CAPSULE | Freq: Three times a day (TID) | ORAL | 0 refills | Status: DC | PRN

## 2021-07-29 NOTE — ED Triage Notes (Signed)
Pt reports headache, chest congestion and sinus pressure x 1 day.

## 2021-07-29 NOTE — ED Provider Notes (Signed)
[place]   MRN: [id number] DOB: 1965/09/30  Subjective:   Amber Crawford is a 55 y.o. female presenting for 1 day history of acute onset sinus congestion, sinus headache, throat congestion, coughing.  No chest pain, shortness of breath or wheezing.  Patient smokes half pack per day.  No neck pain, weakness, numbness or tingling, history of stroke.  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  Does not take anything consistently.  However, she did start using her Flonase again.  No current facility-administered medications for this encounter.  Current Outpatient Medications:    albuterol (VENTOLIN HFA) 108 (90 Base) MCG/ACT inhaler, Inhale 2 puffs into the lungs every 6 (six) hours as needed for wheezing or shortness of breath., Disp: 8 g, Rfl: 0   Blood Pressure Monitor DEVI, 1 kit by Does not apply route 2 (two) times daily as needed., Disp: 1 each, Rfl: bag   cetirizine (ZYRTEC ALLERGY) 10 MG tablet, Take 1 tablet (10 mg total) by mouth daily., Disp: 90 tablet, Rfl: 0   fluticasone (FLONASE) 50 MCG/ACT nasal spray, Place 2 sprays into both nostrils daily. (Patient not taking: Reported on 04/14/2021), Disp: 16 g, Rfl: 0   hydrochlorothiazide (HYDRODIURIL) 25 MG tablet, TAKE 1 TABLET (25 MG TOTAL) BY MOUTH DAILY., Disp: 90 tablet, Rfl: 0   ibuprofen (ADVIL) 800 MG tablet, Take 1 tablet (800 mg total) by mouth 3 (three) times daily., Disp: 21 tablet, Rfl: 0   loratadine (CLARITIN) 10 MG tablet, Take 1 tablet (10 mg total) by mouth daily., Disp: 90 tablet, Rfl: 1   meclizine (ANTIVERT) 25 MG tablet, Take 1 tablet (25 mg total) by mouth 3 (three) times daily as needed for dizziness., Disp: 30 tablet, Rfl: 0   meloxicam (MOBIC) 15 MG tablet, Take 0.5-1 tablets (7.5-15 mg total) by mouth daily as needed for pain., Disp: 30 tablet, Rfl: 6   naproxen (NAPROSYN) 500 MG tablet, Take 1 tablet (500 mg  total) by mouth 2 (two) times daily with a meal., Disp: 30 tablet, Rfl: 0    Allergies  Allergen Reactions   Aspirin Other (See Comments)    Fingers get numb; OK to take acetaminophen and IBU    Past Medical History:  Diagnosis Date   Gastritis      Past Surgical History:  Procedure Laterality Date   CESAREAN SECTION     CHOLECYSTECTOMY      Family History  Family history unknown: Yes    Social History   Tobacco Use   Smoking status: Some Days    Packs/day: 0.50    Types: Cigarettes   Smokeless tobacco: Never  Vaping Use   Vaping Use: Never used  Substance Use Topics   Alcohol use: No   Drug use: No    ROS   Objective:   Vitals: BP (!) 141/82 (BP Location: Left Arm)   Pulse 84   Temp 98.4 F (36.9 C) (Oral)   Resp 18   SpO2 96%   Physical Exam Constitutional:      General: She is not in acute distress.    Appearance: Normal appearance. She is well-developed. She is not ill-appearing, toxic-appearing or diaphoretic.  HENT:     Head: Normocephalic and atraumatic.     Right Ear: External ear normal.     Left Ear: External ear normal.     Nose: Nose normal.     Mouth/Throat:  Mouth: Mucous membranes are moist.  Eyes:     General: No scleral icterus.       Right eye: No discharge.        Left eye: No discharge.     Extraocular Movements: Extraocular movements intact.     Conjunctiva/sclera: Conjunctivae normal.     Pupils: Pupils are equal, round, and reactive to light.  Cardiovascular:     Rate and Rhythm: Normal rate and regular rhythm.     Pulses: Normal pulses.     Heart sounds: Normal heart sounds. No murmur heard.   No friction rub. No gallop.  Pulmonary:     Effort: Pulmonary effort is normal. No respiratory distress.     Breath sounds: Normal breath sounds. No stridor. No wheezing, rhonchi or rales.  Skin:    General: Skin is warm and dry.     Findings: No rash.  Neurological:     Mental Status: She is alert and oriented to  person, place, and time.     Cranial Nerves: No cranial nerve deficit, dysarthria or facial asymmetry.     Motor: No weakness.     Coordination: Coordination normal.     Gait: Gait normal.  Psychiatric:        Mood and Affect: Mood normal.        Behavior: Behavior normal.        Thought Content: Thought content normal.        Judgment: Judgment normal.    Assessment and Plan :   PDMP not reviewed this encounter.  1. Acute viral syndrome   2. Subacute cough   3. Body aches   4. Sinus headache   5. Allergic rhinitis due to other allergic trigger, unspecified seasonality   6. Smoker     Deferred imaging given clear cardiopulmonary exam, hemodynamically stable vital signs. COVID and flu test pending.  We will otherwise manage for viral upper respiratory infection.  Physical exam findings reassuring and vital signs stable for discharge. Advised supportive care, offered symptomatic relief.  Recommended maintaining her allergy medications.  Counseled patient on potential for adverse effects with medications prescribed/recommended today, ER and return-to-clinic precautions discussed, patient verbalized understanding.       Jaynee Eagles, PA-C 07/29/21 1610

## 2021-07-29 NOTE — Discharge Instructions (Signed)
Para el dolor de garganta o tos puede usar un té de miel. Use 3 cucharaditas de miel con jugo exprimido de medio limón. Coloque trozos de jengibre afeitado en 1/2-1 taza de agua y caliente sobre la estufa. Luego mezcle los ingredientes y repita cada 4 horas. Para fiebre, dolores de cuerpo tome ibuprofeno 400mg-600mg con comida cada 6 horas alternando con o junto con Tylenol 500mg-650mg cada 6 horas. Hidrata muy bien con al menos 2 litros (64 onzas) de agua al dia. Coma comidas ligeras como sopas para mantener los electrolitos y nutricion. Tambien puede tomar suero. Comience un antihistamínico como Zyrtec (cetirizina) 10mg al dia. Puede usar pseudoefedrina (Sudafed) de venta libre para el goteo posnasal, congestión a una dosis de 60 mg cada 8 horas o cada 12 horas. Use el jarabe por la noche para su tos y las capsulas durante el dia. ° °

## 2021-07-30 LAB — SARS CORONAVIRUS 2 (TAT 6-24 HRS): SARS Coronavirus 2: POSITIVE — AB

## 2021-08-02 ENCOUNTER — Other Ambulatory Visit: Payer: Self-pay

## 2021-08-02 ENCOUNTER — Ambulatory Visit (HOSPITAL_COMMUNITY)
Admission: EM | Admit: 2021-08-02 | Discharge: 2021-08-02 | Disposition: A | Discharge status: Home / Self Care | Payer: Medicaid Other | Attending: Emergency Medicine | Admitting: Emergency Medicine

## 2021-08-02 ENCOUNTER — Encounter (HOSPITAL_COMMUNITY): Payer: Self-pay

## 2021-08-02 DIAGNOSIS — M62838 Other muscle spasm: Secondary | ICD-10-CM | POA: Diagnosis not present

## 2021-08-02 MED ORDER — METHOCARBAMOL 500 MG PO TABS: 500.0000 mg | ORAL_TABLET | Freq: Three times a day (TID) | ORAL | 0 refills | Status: AC

## 2021-08-02 NOTE — ED Triage Notes (Signed)
Pt tested +for covid last week. She is complaining of cough and back pain.

## 2021-08-02 NOTE — Discharge Instructions (Signed)
You can take Robaxin up to three times daily for the next five days.

## 2021-08-02 NOTE — ED Provider Notes (Signed)
[place]  ____________________________________________  Time seen: Approximately 2:26 PM  I have reviewed the triage vital signs and the nursing notes.   HISTORY  Chief Complaint Cough (Patient reports a +covid test last week and is having back pain.) and Back Pain   Historian Patient    HPI Amber Crawford is a 55 y.o. female presents to the urgent care as she recently recovered from COVID-19 and has had some spasms along the upper trapezius bilaterally.  Patient denies fever or chills.  No dysuria, hematuria or increased urinary frequency.  She denies shortness of breath and states her cough is improving.  Patient states that she would like medication for muscle spasm.   Past Medical History:  Diagnosis Date   Gastritis      Immunizations up to date:  Yes.     Past Medical History:  Diagnosis Date   Gastritis     There are no problems to display for this patient.   Past Surgical History:  Procedure Laterality Date   CESAREAN SECTION     CHOLECYSTECTOMY      Prior to Admission medications   Medication Sig Start Date End Date Taking? Authorizing Provider  methocarbamol (ROBAXIN) 500 MG tablet Take 1 tablet (500 mg total) by mouth 3 (three) times daily for 7 days. 08/02/21 08/09/21 Yes Vallarie Mare M, PA-C  albuterol (VENTOLIN HFA) 108 (90 Base) MCG/ACT inhaler Inhale 2 puffs into the lungs every 6 (six) hours as needed for wheezing or shortness of breath. 07/29/21   Jaynee Eagles, PA-C  benzonatate (TESSALON) 100 MG capsule Take 1-2 capsules (100-200 mg total) by mouth 3 (three) times daily as needed for cough. 07/29/21   Jaynee Eagles, PA-C  Blood Pressure Monitor DEVI 1 kit by Does not apply route 2 (two) times daily as needed. 09/20/19   [name], NP  cetirizine (ZYRTEC ALLERGY) 10 MG tablet Take 1 tablet (10 mg total) by mouth daily. 07/29/21   Jaynee Eagles, PA-C  fluticasone (FLONASE) 50 MCG/ACT nasal spray Place 2 sprays into both  nostrils daily. Patient not taking: Reported on 04/14/2021 01/28/21   Jaynee Eagles, PA-C  hydrochlorothiazide (HYDRODIURIL) 25 MG tablet TAKE 1 TABLET (25 MG TOTAL) BY MOUTH DAILY. 05/26/21   [name], NP  ibuprofen (ADVIL) 800 MG tablet Take 1 tablet (800 mg total) by mouth 3 (three) times daily. 01/06/21   Hazel Sams, PA-C  loratadine (CLARITIN) 10 MG tablet Take 1 tablet (10 mg total) by mouth daily. 12/26/20   Hans Eden, NP  meclizine (ANTIVERT) 25 MG tablet Take 1 tablet (25 mg total) by mouth 3 (three) times daily as needed for dizziness. 04/14/21   Raspet, Derry Skill, PA-C  meloxicam (MOBIC) 15 MG tablet Take 0.5-1 tablets (7.5-15 mg total) by mouth daily as needed for pain. 01/31/20   Hilts, Legrand Como, MD  naproxen (NAPROSYN) 500 MG tablet Take 1 tablet (500 mg total) by mouth 2 (two) times daily with a meal. 12/26/20   White, Adrienne R, NP  promethazine-dextromethorphan (PROMETHAZINE-DM) 6.25-15 MG/5ML syrup Take 5 mLs by mouth at bedtime as needed for cough. 07/29/21   Jaynee Eagles, PA-C  pseudoephedrine (SUDAFED) 60 MG tablet Take 1 tablet (60 mg total) by mouth every 8 (eight) hours as needed for congestion. 07/29/21   Jaynee Eagles, PA-C    Allergies Aspirin  Family History  Family history unknown: Yes    Social History Social History   Tobacco Use   Smoking status: Some  Days    Packs/day: 0.50    Types: Cigarettes   Smokeless tobacco: Never  Vaping Use   Vaping Use: Never used  Substance Use Topics   Alcohol use: No   Drug use: No     Review of Systems  Constitutional: No fever/chills Eyes:  No discharge ENT: No upper respiratory complaints. Respiratory: no cough. No SOB/ use of accessory muscles to breath Gastrointestinal:   No nausea, no vomiting.  No diarrhea.  No constipation. Musculoskeletal: Patient has spasm of upper trapezius.  Skin: Negative for rash, abrasions, lacerations, ecchymosis.    ____________________________________________   PHYSICAL  EXAM:  VITAL SIGNS: ED Triage Vitals  Enc Vitals Group     BP 08/02/21 1330 (!) 146/78     Pulse Rate 08/02/21 1330 65     Resp 08/02/21 1330 16     Temp 08/02/21 1330 98.1 F (36.7 C)     Temp src --      SpO2 08/02/21 1330 100 %     Weight --      Height --      Head Circumference --      Peak Flow --      Pain Score 08/02/21 1331 4     Pain Loc --      Pain Edu? --      Excl. in [place]? --      Constitutional: Alert and oriented. Well appearing and in no acute distress. Eyes: Conjunctivae are normal. PERRL. EOMI. Head: Atraumatic. ENT:      Nose: No congestion/rhinnorhea.      Mouth/Throat: Mucous membranes are moist.  Neck: No stridor.  No cervical spine tenderness to palpation. Cardiovascular: Normal rate, regular rhythm. Normal S1 and S2.  Good peripheral circulation. Respiratory: Normal respiratory effort without tachypnea or retractions. Lungs CTAB. Good air entry to the bases with no decreased or absent breath sounds Gastrointestinal: Bowel sounds x 4 quadrants. Soft and nontender to palpation. No guarding or rigidity. No distention. Musculoskeletal: Full range of motion to all extremities. No obvious deformities noted Neurologic:  Normal for age. No gross focal neurologic deficits are appreciated.  Skin:  Skin is warm, dry and intact. No rash noted. Psychiatric: Mood and affect are normal for age. Speech and behavior are normal.   ____________________________________________   LABS (all labs ordered are listed, but only abnormal results are displayed)  Labs Reviewed - No data to display ____________________________________________  EKG   ____________________________________________  RADIOLOGY   No results found.  ____________________________________________    PROCEDURES  Procedure(s) performed:     Procedures     Medications - No data to display   ____________________________________________   INITIAL IMPRESSION / ASSESSMENT AND PLAN  / ED COURSE  Pertinent labs & imaging results that were available during my care of the patient were reviewed by me and considered in my medical decision making (see chart for details).      Assessment and plan Muscle spasm 55 year old female presents to the urgent care with muscle spasms of the upper trapezius.  She was prescribed Robaxin and a work note was provided.     ____________________________________________  FINAL CLINICAL IMPRESSION(S) / ED DIAGNOSES  Final diagnoses:  Trapezius muscle spasm      NEW MEDICATIONS STARTED DURING THIS VISIT:  ED Discharge Orders          Ordered    methocarbamol (ROBAXIN) 500 MG tablet  3 times daily        08/02/21 1419  This chart was dictated using voice recognition software/Dragon. Despite best efforts to proofread, errors can occur which can change the meaning. Any change was purely unintentional.     Lannie Fields, PA-C 08/02/21 1428

## 2021-08-04 ENCOUNTER — Ambulatory Visit (INDEPENDENT_AMBULATORY_CARE_PROVIDER_SITE_OTHER): Payer: Self-pay

## 2021-08-04 NOTE — Telephone Encounter (Signed)
Pt called to get medication / she has congestion and pain near eyes, nose and head / please advise    Unable to get [facility] [employer] due to technical difficulty. Advised leadership and advised pt NT will call her back.

## 2021-08-04 NOTE — Telephone Encounter (Signed)
Called pt using Spanish interpreter Donald Siva [phone] called pt twice but unable to LM mailbox full. Called emergency contact [facility] and LM to have pt call back.

## 2021-08-04 NOTE — Telephone Encounter (Signed)
Pt called, unable to leave VM d/t mailbox full. Unable to reach patient after 3 attempts by [facility] NT, routing to the provider for resolution per protocol.   Summary: medication wanted for symtpoms   Pt called to get medication / she has congestion and pain near eyes, nose and head / please advise

## 2021-08-14 IMAGING — DX DG CHEST 2V
2 series · 2 of 2 positions shown · non-contrast
Comparison: February 13, 2012.

CLINICAL DATA: Shortness of breath, wheezing, recent COVID.

EXAM:
CHEST - 2 VIEW

[chest pa]
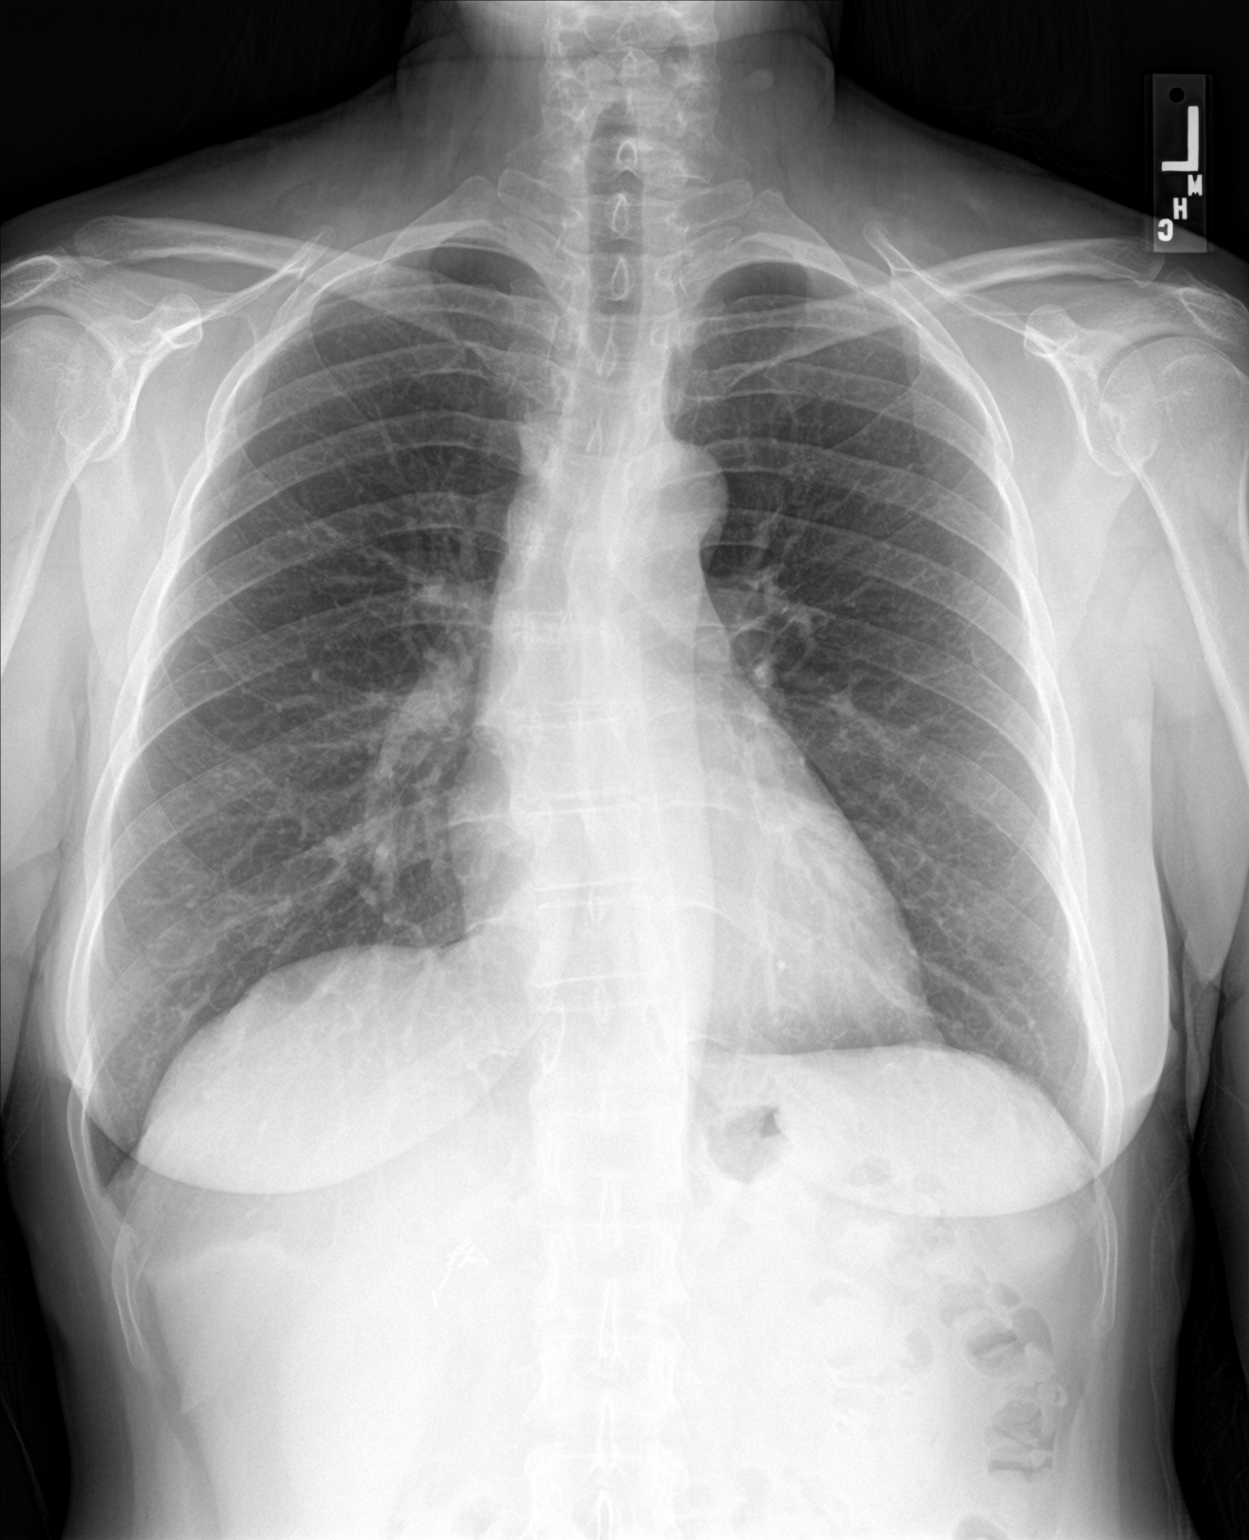

[chest lat]
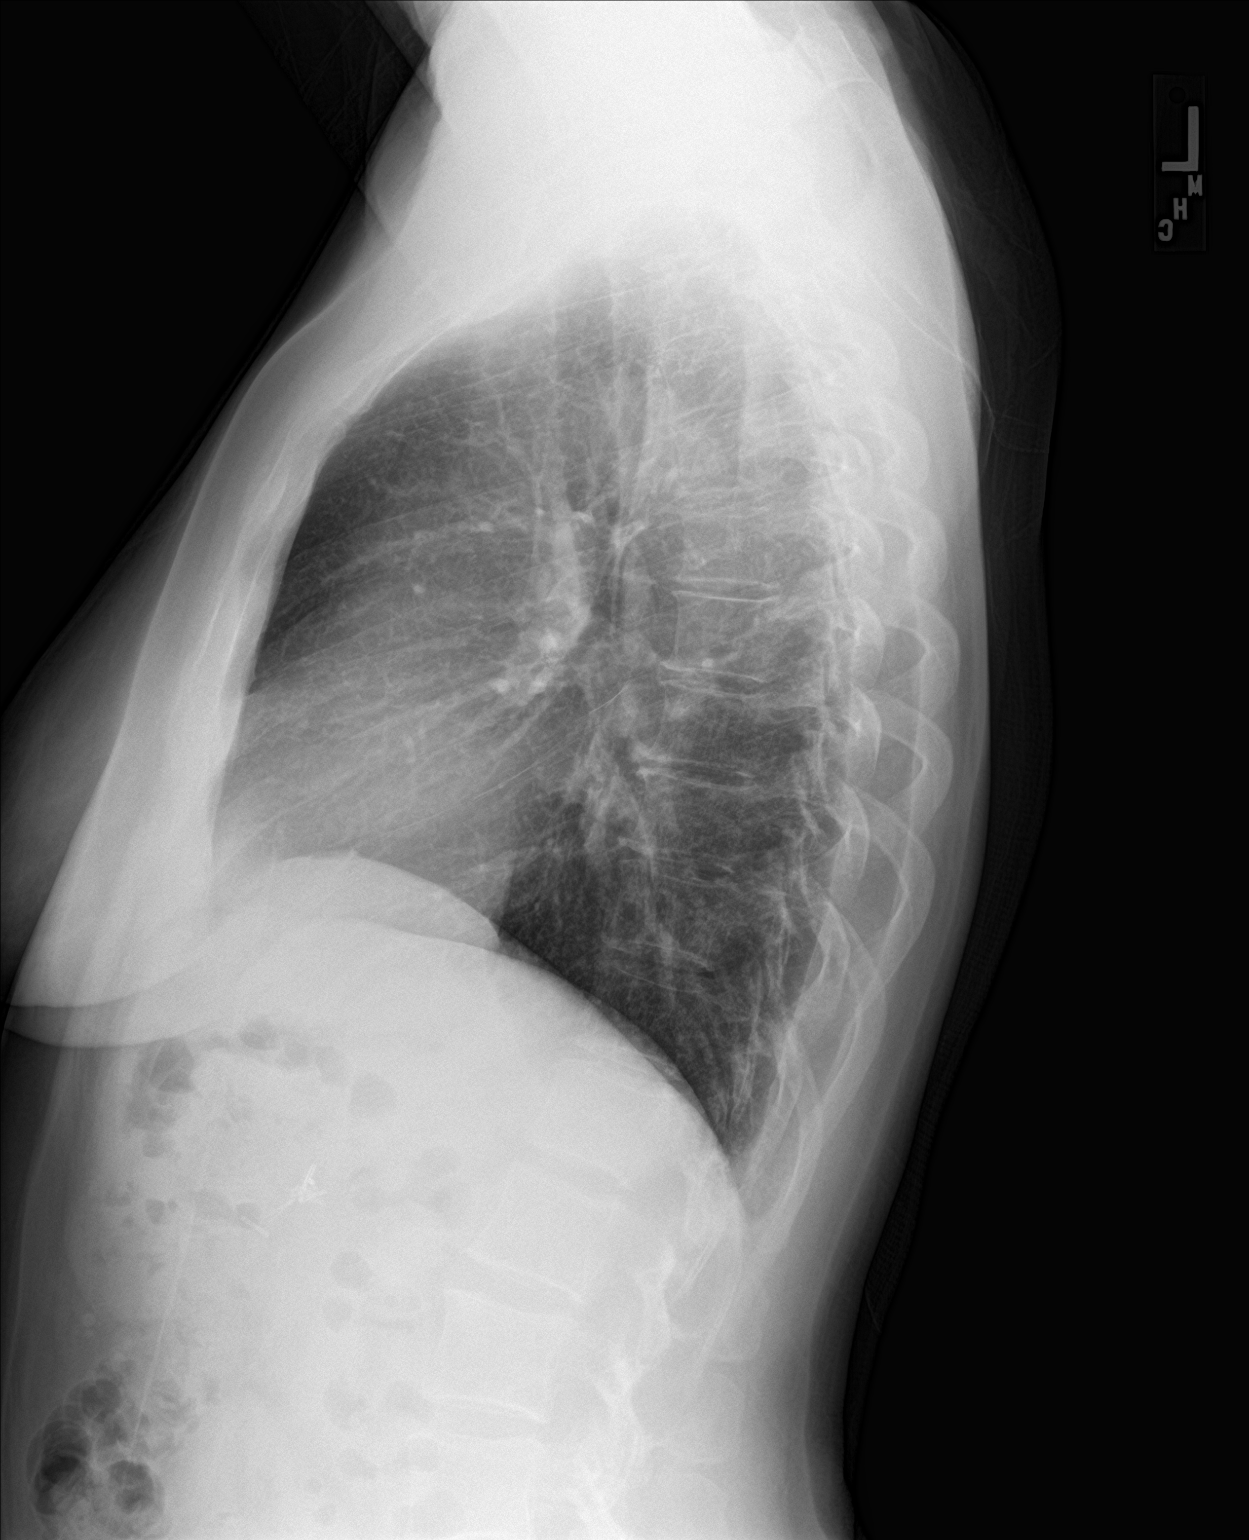

[2 of 2 positions shown; findings below may reference images not displayed]

FINDINGS: The heart size and mediastinal contours are within normal limits.
Both lungs are clear. No visible pleural effusions or pneumothorax.
No acute osseous abnormality. Mild thoracolumbar S-shaped curvature.
Right upper quadrant clips.
IMPRESSION: No active cardiopulmonary disease.

## 2021-08-25 ENCOUNTER — Other Ambulatory Visit (INDEPENDENT_AMBULATORY_CARE_PROVIDER_SITE_OTHER): Payer: Self-pay | Admitting: Primary Care

## 2021-08-25 DIAGNOSIS — I10 Essential (primary) hypertension: Secondary | ICD-10-CM

## 2021-09-14 ENCOUNTER — Other Ambulatory Visit (INDEPENDENT_AMBULATORY_CARE_PROVIDER_SITE_OTHER): Payer: Self-pay | Admitting: Primary Care

## 2021-09-14 DIAGNOSIS — I10 Essential (primary) hypertension: Secondary | ICD-10-CM

## 2021-09-14 NOTE — Telephone Encounter (Signed)
Requested medication (s) are due for refill today: yes  Requested medication (s) are on the active medication list: yes  Last refill:  05/26/21 #90  Future visit scheduled: has upcoming visit with new PCP  Notes to clinic:  needs lab work overdue appt   Requested Prescriptions  Pending Prescriptions Disp Refills   hydrochlorothiazide (HYDRODIURIL) 25 MG tablet [Pharmacy Med Name: HYDROCHLOROTHIAZIDE 25 MG TAB] 90 tablet 0    Sig: TAKE 1 TABLET (25 MG TOTAL) BY MOUTH DAILY.     Cardiovascular: Diuretics - Thiazide Failed - 09/14/2021  1:19 PM      Failed - Ca in normal range and within 360 days    Calcium  Date Value Ref Range Status  07/11/2020 9.2 8.7 - 10.2 mg/dL Final          Failed - Cr in normal range and within 360 days    Creatinine, Ser  Date Value Ref Range Status  07/11/2020 0.58 0.57 - 1.00 mg/dL Final          Failed - K in normal range and within 360 days    Potassium  Date Value Ref Range Status  07/11/2020 3.5 3.5 - 5.2 mmol/L Final          Failed - Na in normal range and within 360 days    Sodium  Date Value Ref Range Status  07/11/2020 142 134 - 144 mmol/L Final          Failed - Last BP in normal range    BP Readings from Last 1 Encounters:  08/02/21 (!) 146/78          Failed - Valid encounter within last 6 months    Recent Outpatient Visits           1 year ago Essential hypertension   [facility] Grayce Sessions, NP   1 year ago Complaint of nasal congestion   [facility] Grayce Sessions, NP   1 year ago Right wrist pain   [facility] Grayce Sessions, NP   1 year ago Blood pressure check   [facility] Grayce Sessions, NP   1 year ago Essential hypertension   [facility] Grayce Sessions, NP

## 2021-09-24 ENCOUNTER — Ambulatory Visit (INDEPENDENT_AMBULATORY_CARE_PROVIDER_SITE_OTHER): Payer: Medicaid Other | Admitting: Internal Medicine

## 2021-09-24 ENCOUNTER — Other Ambulatory Visit: Payer: Self-pay

## 2021-09-24 ENCOUNTER — Encounter: Payer: Self-pay | Admitting: Internal Medicine

## 2021-09-24 VITALS — BP 126/80 | HR 76 | Resp 12 | Ht 59.5 in | Wt 124.0 lb

## 2021-09-24 DIAGNOSIS — I10 Essential (primary) hypertension: Secondary | ICD-10-CM

## 2021-09-24 DIAGNOSIS — J45909 Unspecified asthma, uncomplicated: Secondary | ICD-10-CM | POA: Diagnosis not present

## 2021-09-24 DIAGNOSIS — Z9109 Other allergy status, other than to drugs and biological substances: Secondary | ICD-10-CM | POA: Diagnosis not present

## 2021-09-24 DIAGNOSIS — Z591 Inadequate housing: Secondary | ICD-10-CM

## 2021-09-24 HISTORY — DX: Essential (primary) hypertension: I10

## 2021-09-24 MED ORDER — LORATADINE 10 MG PO TABS: 10.0000 mg | ORAL_TABLET | Freq: Every day | ORAL | 11 refills | Status: AC

## 2021-09-24 MED ORDER — ALBUTEROL SULFATE HFA 108 (90 BASE) MCG/ACT IN AERS: INHALATION_SPRAY | RESPIRATORY_TRACT | 2 refills | Status: DC

## 2021-09-24 MED ORDER — HYDROCHLOROTHIAZIDE 25 MG PO TABS: 25.0000 mg | ORAL_TABLET | Freq: Every day | ORAL | 3 refills | Status: AC

## 2021-09-24 MED ORDER — FLUTICASONE-SALMETEROL 100-50 MCG/ACT IN AEPB: 1.0000 | INHALATION_SPRAY | Freq: Two times a day (BID) | RESPIRATORY_TRACT | 11 refills | Status: DC

## 2021-09-24 MED ORDER — FLUTICASONE PROPIONATE 50 MCG/ACT NA SUSP: 2.0000 | Freq: Every day | NASAL | 11 refills | Status: DC

## 2021-09-24 NOTE — Progress Notes (Signed)
Subjective:    Patient ID: Amber Crawford, female   DOB: 1966/07/29, 56 y.o.   MRN: [id number]   HPI  Here to establish  Amber Crawford interprets   Feels dizzy, describes vertigo, suddenly.  Daily occurrence.  Has had for many years, but seemed to dissipate at one point, and now has returned.  If turns suddenly to left or right, can set balance issues/dizziness off.  Can last up to 2 minutes.  Does at times have nausea, but never vomiting associated.  Always with sinus congestion and pain.      History of allergies:  symptoms are mainly her sinuses, eyes, and nose symptoms of itching, sneezing, watering of eyes and nose.  Never tested for allergies.  Symptoms area really during the colder months.  No spring, summer symptoms.  Fall can be a problem as well.   Her trailer home does have issues with a leaky toilet--goes to one of the other rooms.  There is a mold/mildew smell and causes her to sneeze and have difficulties with breathing.   She owns the trailer.  Difficulty affording repairs.    At time will see lights, squiggly lines, even if closes eyes.  This came up when asking about the dizziness, but states these episodes are not associated with the dizzy episodes. Can get a headache soon after sees the lights.  Sometimes, just sees the lights.   First episode was about 4 years ago.  Occurs maybe twice monthly.  If has an associated HA--lasts all day and is gone in the morning if she can fall asleep.  +photophobia.  No N or V.  Vicks Vapo Rub helps or cold compress on forehead.   HA is bifrontal and feels like heaviness.  Perhaps like a pounding in head.    3.  Hypertension:  Diagnosed 1 1/2 years ago.  HCTZ.    Current Meds  Medication Sig   albuterol (VENTOLIN HFA) 108 (90 Base) MCG/ACT inhaler 2 puffs every 6 hours as needed for wheezing   Blood Pressure Monitor DEVI 1 kit by Does not apply route 2 (two) times daily as needed.   fluticasone-salmeterol (ADVAIR DISKUS)  100-50 MCG/ACT AEPB Inhale 1 puff into the lungs 2 (two) times daily.   ibuprofen (ADVIL) 800 MG tablet Take 1 tablet (800 mg total) by mouth 3 (three) times daily.   loratadine (CLARITIN) 10 MG tablet Take 1 tablet (10 mg total) by mouth daily.   vitamin B-12 (CYANOCOBALAMIN) 1000 MCG tablet Take 1,000 mcg by mouth daily.   [DISCONTINUED] fluticasone (FLONASE) 50 MCG/ACT nasal spray Place 2 sprays into both nostrils daily.   [DISCONTINUED] hydrochlorothiazide (HYDRODIURIL) 25 MG tablet TAKE 1 TABLET (25 MG TOTAL) BY MOUTH DAILY.   [DISCONTINUED] loratadine (CLARITIN) 10 MG tablet Take 1 tablet (10 mg total) by mouth daily.   [DISCONTINUED] promethazine-dextromethorphan (PROMETHAZINE-DM) 6.25-15 MG/5ML syrup Take 5 mLs by mouth at bedtime as needed for cough.   [DISCONTINUED] pseudoephedrine (SUDAFED) 60 MG tablet Take 1 tablet (60 mg total) by mouth every 8 (eight) hours as needed for congestion.     Allergies  Allergen Reactions   Aspirin Other (See Comments)    Fingers get numb; OK to take acetaminophen and IBU     Review of Systems    Objective:   BP 126/80 (BP Location: Left Arm, Patient Position: Sitting, Cuff Size: Normal)    Pulse 76    Resp 12    Ht 4' 11.5" (1.511 m)  Wt 124 lb (56.2 kg)    BMI 24.63 kg/m   Physical Exam NAD HEENT:  PERRL, EOMI, TMs pearly gray.  Nasal mucosa inflamed with nodularity.  Cobbling of posterior pharynx. Neck:  Supple, No adenopathy Chest:  scattered expiratory wheeze.  No crackles  Good air movement CV:  RRR with normal S1 and S2, No S3, S4 or murmur.  Radial and DP pulses normal and equal LE:  No edema.   Assessment & Plan    Environmental allergies:  called and left message with Ms. Sharol Given at [employer]  [phone] to see if she meets criteria for needed toilet leak repairs.  Also sent email to Judie Grieve to see about a Artist.   Start Flonase 2 sprays each nostril daily, not prn.   Loratadine 10 mg daily.  2.  Allergic asthma:  Advair 100/50 in inhalation twice daily.  Albuterol HFA 2 puffs every 6 hours as needed.  Went over how to use the inhalers.  To brush teeth and tongue after use of inhaled corticosteroids.    3.  Hypertension:  controlled on HCTZ.  Stop all decongestants.    4.  Migraines/ophthalmologic migraine equivalent.  5.  Vertigo:  likely related to #1 as possibly are migraines.    Will see what happens with symptoms as housing hopefully improves.

## 2021-09-24 NOTE — Patient Instructions (Addendum)
[employer] Specialist Ms. Rod Holler [phone] For leaking toilet and mold/mildew in home  Denny Peon and [employer] for health homes inspection.

## 2021-10-01 ENCOUNTER — Telehealth: Payer: Self-pay

## 2021-10-01 NOTE — Telephone Encounter (Signed)
Patient called to see if she can get an Rx to help with a headache that she is experiencing. Headache started today 10/01/21, affects mainly the from of her head and face. Patient describes that the headache feels like pressure on her face. Nostrils also feel blocked. Patient is currently taking loratadine and fluticasone which have helped some with congestions, but has not helped with her headache.

## 2021-10-06 NOTE — Telephone Encounter (Signed)
Patient seen by Dr Mulberry  

## 2021-11-17 ENCOUNTER — Other Ambulatory Visit: Payer: Medicaid Other | Admitting: Internal Medicine

## 2021-11-18 ENCOUNTER — Encounter: Payer: Self-pay | Admitting: Internal Medicine

## 2021-11-18 ENCOUNTER — Ambulatory Visit (INDEPENDENT_AMBULATORY_CARE_PROVIDER_SITE_OTHER): Payer: Medicaid Other | Admitting: Internal Medicine

## 2021-11-18 ENCOUNTER — Other Ambulatory Visit: Payer: Self-pay

## 2021-11-18 VITALS — BP 118/62 | HR 80 | Resp 12 | Ht 59.75 in | Wt 124.0 lb

## 2021-11-18 DIAGNOSIS — J302 Other seasonal allergic rhinitis: Secondary | ICD-10-CM

## 2021-11-18 DIAGNOSIS — J45909 Unspecified asthma, uncomplicated: Secondary | ICD-10-CM | POA: Diagnosis not present

## 2021-11-18 DIAGNOSIS — Z9109 Other allergy status, other than to drugs and biological substances: Secondary | ICD-10-CM

## 2021-11-18 DIAGNOSIS — R0981 Nasal congestion: Secondary | ICD-10-CM

## 2021-11-18 DIAGNOSIS — I10 Essential (primary) hypertension: Secondary | ICD-10-CM

## 2021-11-18 DIAGNOSIS — Z591 Inadequate housing: Secondary | ICD-10-CM

## 2021-11-18 DIAGNOSIS — Z5919 Other inadequate housing: Secondary | ICD-10-CM

## 2021-11-18 DIAGNOSIS — R42 Dizziness and giddiness: Secondary | ICD-10-CM

## 2021-11-18 LAB — POC COVID19 BINAXNOW: SARS Coronavirus 2 Ag: NEGATIVE

## 2021-11-18 LAB — POCT INFLUENZA A/B
Influenza A, POC: NEGATIVE
Influenza B, POC: NEGATIVE

## 2021-11-18 MED ORDER — PREDNISONE 10 MG PO TABS: ORAL_TABLET | ORAL | 0 refills | Status: DC

## 2021-11-18 NOTE — Patient Instructions (Signed)
Prednisone:   Day 1-4:  4 tabs by mouth daily Day 5:  3 1/2 tabs Day 6:  3 tabs Day 7:  2 1/2 tabs Day 8:  2 tabs Day 9:  1 1/2 tabs Day 10: 1 tab Day 11:  1/2 tab Day 12:  done  

## 2021-11-18 NOTE — Progress Notes (Signed)
? ? ?Subjective:  ?  ?Patient ID: Amber Crawford, female   DOB: 30-Nov-1965, 56 y.o.   MRN: [id number] ? ? ?HPI ? ?Interpreted by Amber Crawford ? ? Mold in home:  states she was called 3 days after last here and whether was [facility] or [employer], was told they would get back to her and she has not heard anything since.  [facility] and spoke with Amber Crawford.  She did contact [name] and seems she was to get back once she found the deed/title.  Gives a history that her ex husband lost the title and when they separated and she and her daughter stayed in the mobile home, she did not get the title.  Sounds like her ex and a friend of his purchased the trailer together over time and then the friend handed the title over to her husband.   ?Apparently, [name] was hesitant to have [facility] rep or [facility] rep evaluate the issue in her home.  Patient states she does not have a problem with them coming into her home, she was just warning the that the problem is a very bad sight.   ? ?2.  Allergies/Asthma/Nausea/dizziness:  No improvement.  Patient states the leaking toilet now leaking into the kitchen.  As above, she has not had any improvement in her home environment.  No fever.  Mucous is white.   ? ?Current Meds  ?Medication Sig  ? albuterol (VENTOLIN HFA) 108 (90 Base) MCG/ACT inhaler 2 puffs every 6 hours as needed for wheezing  ? fluticasone (FLONASE) 50 MCG/ACT nasal spray Place 2 sprays into both nostrils daily.  ? fluticasone-salmeterol (ADVAIR DISKUS) 100-50 MCG/ACT AEPB Inhale 1 puff into the lungs 2 (two) times daily.  ? hydrochlorothiazide (HYDRODIURIL) 25 MG tablet Take 1 tablet (25 mg total) by mouth daily.  ? loratadine (CLARITIN) 10 MG tablet Take 1 tablet (10 mg total) by mouth daily.  ? ?Allergies  ?Allergen Reactions  ? Aspirin Other (See Comments)  ?  Fingers get numb; OK to take acetaminophen and IBU  ? ? ? ?Review of Systems ? ? ? ?Objective:  ? ?BP 118/62 (BP Location: Left Arm, Patient  Position: Sitting, Cuff Size: Normal)   Pulse 80   Resp 12   Ht 4' 11.75" (1.518 m)   Wt 124 lb (56.2 kg)   LMP 11/05/2021 (Within Days)   BMI 24.42 kg/m?  ? ?Physical Exam ?HENT:  ?   Head: Normocephalic and atraumatic.  ?   Comments: Tender over frontal and maxillary sinuses. ?   Right Ear: Tympanic membrane, ear canal and external ear normal.  ?   Left Ear: Tympanic membrane, ear canal and external ear normal.  ?   Nose: Congestion and rhinorrhea present.  ?   Mouth/Throat:  ?   Mouth: Mucous membranes are moist.  ?   Pharynx: Oropharynx is clear.  ?Eyes:  ?   Extraocular Movements: Extraocular movements intact.  ?   Conjunctiva/sclera: Conjunctivae normal.  ?   Pupils: Pupils are equal, round, and reactive to light.  ?   Comments: Bilateral eyes watery and injected  ?Cardiovascular:  ?   Rate and Rhythm: Normal rate and regular rhythm.  ?Pulmonary:  ?   Effort: Pulmonary effort is normal.  ?   Breath sounds: Normal breath sounds. No wheezing.  ?Abdominal:  ?   General: Bowel sounds are normal.  ?   Palpations: Abdomen is soft.  ?Musculoskeletal:  ?   Cervical  back: Normal range of motion and neck supple.  ?Neurological:  ?   Mental Status: She is alert.  ? ? ? ?Assessment & Plan  ? Poor home environment with toilet leak leading to significant mold exposure, causing allergies and allergic asthma exacerbation:  Prednisone burst of 40 mg daily x 4 days, then taper.  Continue other meds.   ?Amber Crawford, [employer], ext 100--left message.  See what needs to happen regarding title of the home after discussing with Amber Crawford.  Report given to Amber Crawford to contact patient and see if can walk through looking for title with patient. ?COVID and influenza testing negative ?

## 2021-11-24 ENCOUNTER — Ambulatory Visit: Payer: Medicaid Other | Admitting: Internal Medicine

## 2021-12-01 ENCOUNTER — Telehealth: Payer: Self-pay | Admitting: Internal Medicine

## 2021-12-01 MED ORDER — AMOXICILLIN-POT CLAVULANATE 875-125 MG PO TABS: ORAL_TABLET | ORAL | 0 refills | Status: DC

## 2021-12-01 NOTE — Telephone Encounter (Signed)
Reportedly from incoming phone call today, no better with congestion symptoms following course of prednisone. ?Will treat with Augmentin--no allergies to antibiotics--and see if this helps ?Also reportedly new info regarding title of trailer home. ?Will have Arlana Lindau call and see what is going on to have remediation of mold from home. ? ?

## 2021-12-01 NOTE — Telephone Encounter (Signed)
Patient has been notified of rx and has been instructed to call if she developed yeast infection symptoms  ?

## 2021-12-15 ENCOUNTER — Other Ambulatory Visit: Payer: Self-pay | Admitting: Internal Medicine

## 2021-12-22 NOTE — Telephone Encounter (Signed)
Please see if she is asking for this or if just sent by pharmacy ?

## 2021-12-26 NOTE — Telephone Encounter (Signed)
Confirmed with patient that she did not make this rx request. Request was autoreinfected by the pharmacy  ?

## 2022-01-13 ENCOUNTER — Ambulatory Visit: Payer: Medicaid Other | Admitting: Internal Medicine

## 2022-02-16 ENCOUNTER — Ambulatory Visit: Payer: Medicaid Other | Admitting: Internal Medicine

## 2022-02-16 ENCOUNTER — Encounter: Payer: Self-pay | Admitting: Internal Medicine

## 2022-02-16 VITALS — BP 110/70 | HR 80 | Resp 16 | Ht 59.75 in | Wt 127.0 lb

## 2022-02-16 DIAGNOSIS — Z1231 Encounter for screening mammogram for malignant neoplasm of breast: Secondary | ICD-10-CM | POA: Diagnosis not present

## 2022-02-16 DIAGNOSIS — J45909 Unspecified asthma, uncomplicated: Secondary | ICD-10-CM | POA: Diagnosis not present

## 2022-02-16 DIAGNOSIS — Z9109 Other allergy status, other than to drugs and biological substances: Secondary | ICD-10-CM

## 2022-02-16 DIAGNOSIS — I10 Essential (primary) hypertension: Secondary | ICD-10-CM | POA: Diagnosis not present

## 2022-02-16 NOTE — Progress Notes (Signed)
    Subjective:    Patient ID: Amber Crawford, female   DOB: [birth year], 56 y.o.   MRN: [id number]   HPI  Amber Crawford interprets   Poor home environment:  CHW, Arlana Lindau, has been to trailer home and reportedly not really salvageable.  Does not have the funding to go anywhere else currently.  May have a trailer in the near future where she can move.  No heat or air in her current trailer.  She is not allowed to use a window air unit or the land owner will increase rent of land trailer is on.  Landlords don't like the way it looks.   States her 64 yo daughter is now living with her sister, but will likely have to move back as sister may be moving out of state.   Currently pays $600 for rent and makes $800-900 twice monthly.   She has looked into some places, but has not really investigated enough.   She would appreciate Arlana Lindau helping with this.    2.  Asthma and allergies:  Has not  needed albuterol for a week.  Not using Advair discus regularly--last use was a week ago as well.  States using Flonase only as needed as well.  Discussed similar medication to that in Advair and used to prevent need for other meds.    3.  Hypertension:  using HCTZ.  No problems.    4.  How does one know if going through or completing menopause.  No period for 6 months but no hot flashes or night sweats.    Current Meds  Medication Sig   albuterol (VENTOLIN HFA) 108 (90 Base) MCG/ACT inhaler 2 puffs every 6 hours as needed for wheezing   fluticasone (FLONASE) 50 MCG/ACT nasal spray Place 2 sprays into both nostrils daily.   fluticasone-salmeterol (ADVAIR DISKUS) 100-50 MCG/ACT AEPB Inhale 1 puff into the lungs 2 (two) times daily.   hydrochlorothiazide (HYDRODIURIL) 25 MG tablet Take 1 tablet (25 mg total) by mouth daily.   loratadine (CLARITIN) 10 MG tablet Take 1 tablet (10 mg total) by mouth daily.   Allergies  Allergen Reactions   Aspirin Other (See Comments)    Fingers get numb;  OK to take acetaminophen and IBU     Review of Systems    Objective:   BP 110/70 (BP Location: Right Arm, Patient Position: Sitting, Cuff Size: Normal)   Pulse 80   Resp 16   Ht 4' 11.75" (1.518 m)   Wt 127 lb (57.6 kg)   BMI 25.01 kg/m   Physical Exam NAD HEENT:  PERRL, EOMI, conjunctivae without injection.  TMs pearly gray Mild cobbling of throat. Nasal mucosa normal Neck:  Supple, No adenopathy Chest:  CTA CV:  RRR without murmur or rub.  Radial and DP pulses normal and equal  Assessment & Plan    Environmental allergies/asthma:  discussed again importance of taking maintenance inhaler regularly.  Have not been able to get patient to consider alternative living arrangement and very concerned also for her daughter's health.  Our CHW, Arlana Lindau, will continue to work with her on this.    2.  Perimenopause:  discussed with patient.    3.  Hypertension:  controlled  4.  HM:  mammogram ordered.  3.

## 2022-05-25 ENCOUNTER — Encounter: Payer: Medicaid Other | Admitting: Internal Medicine

## 2022-11-19 ENCOUNTER — Telehealth: Payer: Self-pay

## 2022-11-19 NOTE — Telephone Encounter (Signed)
I have called patient multiple times and LVM to book her an appointment for Asthma per Dr. Amil Amen request, but patient has not answer or call back. I called her today again 11/19/22 but patient did not answered.

## 2023-05-18 ENCOUNTER — Telehealth: Payer: Self-pay

## 2023-05-18 NOTE — Telephone Encounter (Signed)
Patient would like to be on the wait list for headache and breathing (possibly asthma).

## 2023-05-24 ENCOUNTER — Encounter: Payer: Self-pay | Admitting: Internal Medicine

## 2023-05-24 ENCOUNTER — Ambulatory Visit (INDEPENDENT_AMBULATORY_CARE_PROVIDER_SITE_OTHER): Payer: Medicaid Other | Admitting: Internal Medicine

## 2023-05-24 VITALS — BP 134/84 | HR 84 | Resp 16 | Ht 59.75 in | Wt 135.0 lb

## 2023-05-24 DIAGNOSIS — R002 Palpitations: Secondary | ICD-10-CM | POA: Diagnosis not present

## 2023-05-24 DIAGNOSIS — I10 Essential (primary) hypertension: Secondary | ICD-10-CM

## 2023-05-24 DIAGNOSIS — J45909 Unspecified asthma, uncomplicated: Secondary | ICD-10-CM

## 2023-05-24 NOTE — Telephone Encounter (Signed)
Patient has been scheduled

## 2023-05-24 NOTE — Progress Notes (Signed)
Subjective:    Patient ID: Amber Crawford, female   DOB: Jul 06, 1966, 57 y.o.   MRN: [id number]   HPI   Poor home environment with moisture issues and mold.  Has not been seen in some time, but states her landlord took care of the issues and living environment now fine.  She at times will have nasal congestion and frontal sinus pain.  She takes tylenol sinus for these symptoms and states it helps.  He daughter is living with her now--had lived with patient's older daughter for a while when the mold was bad.   She is not using her allergy or asthma meds.  She cannot recall when she last used her rescue inhaler.   2.  Palpitations:  Has noted for past week.  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  Had had occur once at home.  She describes sense of her heart beating slowly.  Has not taken her pulse at the time.  Can last about 1 minute and then resolves.  Can reoccur perhaps 1-2 hours later.  She feels tired with the palpitations, but no presyncope.  Able to continue to do her work.  Generally 2-3 episodes daily. Denies change in diet, change in caffeine intake, new medication  Has not used rescue inhaler or other inhaler.   Has a couple cups of coffee in the morning and occasional Coke during the day. Later, becomes clear she has had palpitations for at least a month.  Perhaps had just one episode prior to this past week.  3.  Hypertension: was seen at another clinic near where she lives and her hydrochlorothiazide was cut to 12.5 mg daily.  This was perhaps 1 month ago.   Also states she was given an injection, perhaps in a trigger point in her upper back as she was so tense with another provider.    Current Meds  Medication Sig   hydrochlorothiazide (HYDRODIURIL) 25 MG tablet Take 1 tablet (25 mg total) by mouth daily. (Patient taking differently: Take 25 mg by mouth daily. 0.5 tab by mouth daily.)   Allergies  Allergen Reactions   Aspirin Other (See Comments)     Fingers get numb; OK to take acetaminophen and IBU     Review of Systems    Objective:   BP 134/84 (BP Location: Right Arm, Patient Position: Sitting, Cuff Size: Normal)   Pulse 84   Resp 16   Ht 4' 11.75" (1.518 m)   Wt 135 lb (61.2 kg)   BMI 26.59 kg/m   Physical Exam NAD HEENT:  PERRL, EOMI, conjunctivae without injection Neck:  Supple, No adenopathy, no thyromegaly Chest:  CTA CV:  RRR with normal S1 and S2, No S3, S4 or murmur.  No carotid bruits.  Carotids, radial and DP pulses normal and equal LE:  No edema   Assessment & Plan   Reported brady palpitations:  ECG, CBC, CMP, TSH.  She is to record episodes on her digital bp monitor and take a photo of result.  To bring in along with home monitor for repeat BP check in 2 weeks.  2.  Hypertension:  upper limits of normal with BP.  Not clear why hydrochlorothiazide decreased.  Return in 2 weeks for repeat BP as above and will decide if better to be on 25 mg of hydrochlorothiazide.   Encouraged her to pick a primary care provider and stick with that provider to prevent problems with her care.  3.  Allergies and Asthma:  reportedly, her issue with mold and moisture in trailer home have been remedied, though her trailer was felt to likely be unsalvageable per [facility] who performed home visit in 2023.

## 2023-05-25 LAB — CBC WITH DIFFERENTIAL/PLATELET
Basophils Absolute: 0.1 10*3/uL (ref 0.0–0.2)
Basos: 1 %
EOS (ABSOLUTE): 0.4 10*3/uL (ref 0.0–0.4)
Eos: 6 %
Hematocrit: 41.3 % (ref 34.0–46.6)
Hemoglobin: 14.6 g/dL (ref 11.1–15.9)
Immature Grans (Abs): 0 10*3/uL (ref 0.0–0.1)
Immature Granulocytes: 0 %
Lymphocytes Absolute: 2.4 10*3/uL (ref 0.7–3.1)
Lymphs: 39 %
MCH: 33.9 pg — ABNORMAL HIGH (ref 26.6–33.0)
MCHC: 35.4 g/dL (ref 31.5–35.7)
MCV: 96 fL (ref 79–97)
Monocytes Absolute: 0.5 10*3/uL (ref 0.1–0.9)
Monocytes: 8 %
Neutrophils Absolute: 3 10*3/uL (ref 1.4–7.0)
Neutrophils: 46 %
Platelets: 247 10*3/uL (ref 150–450)
RBC: 4.31 x10E6/uL (ref 3.77–5.28)
RDW: 12.7 % (ref 11.7–15.4)
WBC: 6.3 10*3/uL (ref 3.4–10.8)

## 2023-05-25 LAB — COMPREHENSIVE METABOLIC PANEL
ALT: 13 [IU]/L (ref 0–32)
AST: 17 [IU]/L (ref 0–40)
Albumin: 4.6 g/dL (ref 3.8–4.9)
Alkaline Phosphatase: 85 [IU]/L (ref 44–121)
BUN/Creatinine Ratio: 23 (ref 9–23)
BUN: 13 mg/dL (ref 6–24)
Bilirubin Total: 0.5 mg/dL (ref 0.0–1.2)
CO2: 25 mmol/L (ref 20–29)
Calcium: 9.3 mg/dL (ref 8.7–10.2)
Chloride: 100 mmol/L (ref 96–106)
Creatinine, Ser: 0.57 mg/dL (ref 0.57–1.00)
Globulin, Total: 2.3 g/dL (ref 1.5–4.5)
Glucose: 86 mg/dL (ref 70–99)
Potassium: 3.8 mmol/L (ref 3.5–5.2)
Sodium: 140 mmol/L (ref 134–144)
Total Protein: 6.9 g/dL (ref 6.0–8.5)
eGFR: 106 mL/min/{1.73_m2} (ref >59)

## 2023-05-25 LAB — TSH: TSH: 2.13 u[IU]/mL (ref 0.450–4.500)

## 2023-06-07 NOTE — Telephone Encounter (Signed)
Patient has been seen.

## 2023-06-08 ENCOUNTER — Other Ambulatory Visit: Payer: Medicaid Other

## 2023-06-08 VITALS — BP 150/78 | HR 72

## 2023-06-08 DIAGNOSIS — Z0130 Encounter for examination of blood pressure without abnormal findings: Secondary | ICD-10-CM | POA: Diagnosis not present

## 2023-06-08 NOTE — Progress Notes (Signed)
Patient reports that she is taking bp medication consistently. At home bp is generally in 130/70. Once was 80/60, but she is not sure if it was an error and did not have any sx when she got that reading. Patient did not take bp medication this morning.   Patient reports that she believes palpitation may be due to anxiety and stress. Notices if she is thinking too much about situations or her health it triggers palpitations. Palpitations are less frequent, happens about twice per day and usually at work.   Patient reports that her HR is fast when she has palpitations.

## 2023-06-18 NOTE — Progress Notes (Signed)
She needs to bring in her monitor to compare and again, she needs to take a photo of HR of monitor when has palpitations.  Also to ask about need for appt for stress and if HR is fast or slow when she has palpitations with stress.

## 2023-06-30 ENCOUNTER — Encounter: Payer: Medicaid Other | Admitting: Internal Medicine

## 2023-09-11 ENCOUNTER — Emergency Department
Admission: EM | Admit: 2023-09-11 | Discharge: 2023-09-11 | Disposition: A | Discharge status: Home / Self Care | Payer: Medicaid Other | Attending: Emergency Medicine | Admitting: Emergency Medicine

## 2023-09-11 ENCOUNTER — Other Ambulatory Visit: Payer: Self-pay

## 2023-09-11 DIAGNOSIS — R1030 Lower abdominal pain, unspecified: Secondary | ICD-10-CM | POA: Insufficient documentation

## 2023-09-11 DIAGNOSIS — R112 Nausea with vomiting, unspecified: Secondary | ICD-10-CM | POA: Diagnosis present

## 2023-09-11 DIAGNOSIS — I10 Essential (primary) hypertension: Secondary | ICD-10-CM | POA: Insufficient documentation

## 2023-09-11 LAB — COMPREHENSIVE METABOLIC PANEL
ALT: 14 U/L (ref 0–44)
AST: 18 U/L (ref 15–41)
Albumin: 3.9 g/dL (ref 3.5–5.0)
Alkaline Phosphatase: 66 U/L (ref 38–126)
Anion gap: 9 (ref 5–15)
BUN: 19 mg/dL (ref 6–20)
CO2: 27 mmol/L (ref 22–32)
Calcium: 9 mg/dL (ref 8.9–10.3)
Chloride: 102 mmol/L (ref 98–111)
Creatinine, Ser: 0.88 mg/dL (ref 0.44–1.00)
GFR, Estimated: >60 mL/min (ref >60)
Glucose, Bld: 115 mg/dL — ABNORMAL HIGH (ref 70–99)
Potassium: 3.1 mmol/L — ABNORMAL LOW (ref 3.5–5.1)
Sodium: 138 mmol/L (ref 135–145)
Total Bilirubin: 0.6 mg/dL (ref 0.0–1.2)
Total Protein: 6.9 g/dL (ref 6.5–8.1)

## 2023-09-11 LAB — URINALYSIS, ROUTINE W REFLEX MICROSCOPIC
Bilirubin Urine: NEGATIVE
Glucose, UA: NEGATIVE mg/dL
Ketones, ur: NEGATIVE mg/dL
Leukocytes,Ua: NEGATIVE
Nitrite: NEGATIVE
Protein, ur: NEGATIVE mg/dL
RBC / HPF: >50 RBC/hpf (ref 0–5)
Specific Gravity, Urine: 1.018 (ref 1.005–1.030)
pH: 6 (ref 5.0–8.0)

## 2023-09-11 LAB — CBC
HCT: 39.3 % (ref 36.0–46.0)
Hemoglobin: 13.6 g/dL (ref 12.0–15.0)
MCH: 31.9 pg (ref 26.0–34.0)
MCHC: 34.6 g/dL (ref 30.0–36.0)
MCV: 92 fL (ref 80.0–100.0)
Platelets: 241 10*3/uL (ref 150–400)
RBC: 4.27 MIL/uL (ref 3.87–5.11)
RDW: 12.8 % (ref 11.5–15.5)
WBC: 11.8 10*3/uL — ABNORMAL HIGH (ref 4.0–10.5)
nRBC: 0 % (ref 0.0–0.2)

## 2023-09-11 LAB — LIPASE, BLOOD: Lipase: 36 U/L (ref 11–51)

## 2023-09-11 LAB — TROPONIN I (HIGH SENSITIVITY): Troponin I (High Sensitivity): 4 ng/L (ref <18)

## 2023-09-11 MED ORDER — ONDANSETRON 8 MG PO TBDP: 8.0000 mg | ORAL_TABLET | Freq: Three times a day (TID) | ORAL | 0 refills | Status: DC | PRN

## 2023-09-11 NOTE — ED Triage Notes (Signed)
Pt presents to ER from hotel complaints of generalized abdominal pain, nausea. Pt reports abdominal pain started after she ate some chicken wings. Pt denies any episodes of vomiting. Pt in by EMS reports medications for nausea that EMS administered helped with nausea.

## 2023-09-11 NOTE — ED Triage Notes (Signed)
First nurse note: Patient arrived by [facility] from  hotel. Patient reports after eating started having abdominal pain. Unable to have BM. Reports generalized abdominal pain.  Last BM X4 days  EMS reports 1 episode emesis  EMS administered 4mg  zofran IV   EMS vitals: 142/80b/p 74HR 98% RA 110CBG Normal RR

## 2023-09-11 NOTE — ED Provider Notes (Signed)
[facility] Provider Note   Event Date/Time   First MD Initiated Contact with Patient 09/11/23 1807     (approximate) History  Abdominal Pain  HPI Amber Crawford is a 58 y.o. female with a past medical history of gastritis and hypertension who presents complaining of lower abdominal pain with associated nausea and vomiting that began after eating fast food.  Patient states that approximately 1 to 2 hours after eating today she began feeling generalized abdominal pain that then went to the lower abdomen and was associated with multiple episodes of nausea/vomiting.  Patient called EMS who administered 4 mg of IV Zofran with resolution of patient's symptoms.  Patient states that she does not have any abdominal pain or does she have any continued nausea/vomiting. ROS: Patient currently denies any vision changes, tinnitus, difficulty speaking, facial droop, sore throat, chest pain, shortness of breath, diarrhea, dysuria, or weakness/numbness/paresthesias in any extremity   Physical Exam  Triage Vital Signs: ED Triage Vitals  Encounter Vitals Group     BP 09/11/23 1537 132/72     Systolic BP Percentile --      Diastolic BP Percentile --      Pulse Rate 09/11/23 1537 70     Resp 09/11/23 1537 (!) 21     Temp 09/11/23 1537 98.2 F (36.8 C)     Temp Source 09/11/23 1537 Oral     SpO2 09/11/23 1537 98 %     Weight 09/11/23 1531 135 lb (61.2 kg)     Height 09/11/23 1531 5' (1.524 m)     Head Circumference --      Peak Flow --      Pain Score 09/11/23 1530 9     Pain Loc --      Pain Education --      Exclude from Growth Chart --    Most recent vital signs: Vitals:   09/11/23 1537  BP: 132/72  Pulse: 70  Resp: (!) 21  Temp: 98.2 F (36.8 C)  SpO2: 98%   General: Awake, oriented x4. CV:  Good peripheral perfusion.  Resp:  Normal effort.  Abd:  No distention.  Other:  Middle-aged overweight Hispanic female resting comfortably in no acute  distress ED Results / Procedures / Treatments  Labs (all labs ordered are listed, but only abnormal results are displayed) Labs Reviewed  COMPREHENSIVE METABOLIC PANEL - Abnormal; Notable for the following components:      Result Value   Potassium 3.1 (*)    Glucose, Bld 115 (*)    All other components within normal limits  CBC - Abnormal; Notable for the following components:   WBC 11.8 (*)    All other components within normal limits  URINALYSIS, ROUTINE W REFLEX MICROSCOPIC - Abnormal; Notable for the following components:   Color, Urine YELLOW (*)    APPearance HAZY (*)    Hgb urine dipstick LARGE (*)    Bacteria, UA RARE (*)    All other components within normal limits  LIPASE, BLOOD  TROPONIN I (HIGH SENSITIVITY)  TROPONIN I (HIGH SENSITIVITY)   EKG ED ECG REPORT I, Merwyn Katos, the attending physician, personally viewed and interpreted this ECG. Date: 09/11/2023 EKG Time: 1534 Rate: 72 Rhythm: normal sinus rhythm QRS Axis: normal Intervals: normal ST/T Wave abnormalities: normal Narrative Interpretation: no evidence of acute ischemia PROCEDURES: Critical Care performed: No Procedures MEDICATIONS ORDERED IN ED: Medications - No data to display IMPRESSION / MDM / ASSESSMENT AND PLAN /  ED COURSE  I reviewed the triage vital signs and the nursing notes.                             The patient is on the cardiac monitor to evaluate for evidence of arrhythmia and/or significant heart rate changes. Patient's presentation is most consistent with acute presentation with potential threat to life or bodily function. Patient presents for acute nausea/vomiting The cause of the patient's symptoms is not clear, but the patient is overall well appearing and is suspected to have a transient course of illness.  Given History and Exam there does not appear to be an emergent cause of the symptoms such as small bowel obstruction, coronary syndrome, bowel ischemia, DKA,  pancreatitis, appendicitis, other acute abdomen or other emergent problem.  Reassessment: After treatment, the patient is feeling much better, tolerating PO fluids, and shows no signs of dehydration.   Disposition: Discharge home with prompt primary care physician follow up in the next 48 hours. Strict return precautions discussed.   FINAL CLINICAL IMPRESSION(S) / ED DIAGNOSES   Final diagnoses:  Lower abdominal pain  Nausea and vomiting, unspecified vomiting type   Rx / DC Orders   ED Discharge Orders          Ordered    ondansetron (ZOFRAN-ODT) 8 MG disintegrating tablet  Every 8 hours PRN        09/11/23 1820           Note:  This document was prepared using Dragon voice recognition software and may include unintentional dictation errors.   Merwyn Katos, MD 09/11/23 [id number]

## 2023-12-02 ENCOUNTER — Ambulatory Visit (HOSPITAL_COMMUNITY)
Admission: RE | Admit: 2023-12-02 | Discharge: 2023-12-02 | Disposition: A | Discharge status: Home / Self Care | Source: Ambulatory Visit

## 2023-12-02 ENCOUNTER — Encounter (HOSPITAL_COMMUNITY): Payer: Self-pay

## 2023-12-02 VITALS — BP 135/77 | HR 76 | Temp 98.3°F | Resp 16

## 2023-12-02 DIAGNOSIS — M791 Myalgia, unspecified site: Secondary | ICD-10-CM | POA: Diagnosis not present

## 2023-12-02 DIAGNOSIS — M6289 Other specified disorders of muscle: Secondary | ICD-10-CM

## 2023-12-02 DIAGNOSIS — M542 Cervicalgia: Secondary | ICD-10-CM | POA: Diagnosis not present

## 2023-12-02 DIAGNOSIS — R42 Dizziness and giddiness: Secondary | ICD-10-CM

## 2023-12-02 MED ORDER — KETOROLAC TROMETHAMINE 30 MG/ML IJ SOLN
30.0000 mg | Freq: Once | INTRAMUSCULAR | Status: AC
  Administered 2023-12-02: 30 mg via INTRAMUSCULAR

## 2023-12-02 MED ORDER — CYCLOBENZAPRINE HCL 10 MG PO TABS: 10.0000 mg | ORAL_TABLET | Freq: Two times a day (BID) | ORAL | 0 refills | Status: DC | PRN

## 2023-12-02 MED ORDER — KETOROLAC TROMETHAMINE 30 MG/ML IJ SOLN
INTRAMUSCULAR | Status: AC
  Filled 2023-12-02: qty 1

## 2023-12-02 MED ORDER — MECLIZINE HCL 12.5 MG PO TABS: 12.5000 mg | ORAL_TABLET | Freq: Three times a day (TID) | ORAL | 0 refills | Status: DC | PRN

## 2023-12-02 NOTE — Discharge Instructions (Addendum)
 The Toradol injection given today should start to work in about 30 minutes. Please do not use any NSAIDs (ibuprofen/Advil, naproxen/Aleve, etc) for the next 12 hours. You can safely use tylenol.  You can take the muscle relaxer (Flexeril) twice daily. If the medication makes you drowsy, take only at bed time. Please call the orthopedic clinic to make a follow up appointment about your pain. Meclizine 1 tablet three times daily if needed for vertigo/dizziness You can scan the QR code on the last page to get established with a primary care provider  Please go to the emergency department if symptoms worsen  La inyeccin de Toradol que le administraron hoy debera empezar a Scientist, water quality en unos 30 minutos. No use ningn AINE (ibuprofeno/Advil, naproxeno/Aleve, etc.) durante las prximas 12 horas. Puede usar Tylenol sin problema. Puede tomar el relajante muscular (Flexeril) dos veces al da. Si el medicamento le produce somnolencia, tmelo solo antes de [place]. Llame a la clnica ortopdica para programar una cita de seguimiento sobre su dolor.  Meclizina: 1 tableta tres veces al da si es necesario para el vrtigo/mareo.  Puede escanear el cdigo QR en la ltima pgina para programar una cita con un mdico de cabecera. Si los sntomas empeoran, acuda a urgencias.

## 2023-12-02 NOTE — ED Provider Notes (Signed)
 [facility]    CSN: [id number] Arrival date & time: 12/02/23  1719     History   Chief Complaint Chief Complaint  Patient presents with   Appointment    HPI Amber Crawford is a 58 y.o. female.  Daughter translates per patient request 2 week history of bilateral neck and upper back pain. Feels it in her shoulders too. Currently rated 7/10. Denies injury, trauma, fall. She does a lot of heavy lifting at work. Muscles feel tight.  No weakness, numbness or tingling of extremities.  Has taken advil, last dose a few days ago No fevers  Also feels the room spins when she turns her head At first described it as feeling "drunk". Does not occur at rest.  Also present for about 2 weeks but maybe longer. 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 Authorizing Provider  cetirizine (ZYRTEC) 10 MG tablet Take 10 mg by mouth daily. 11/04/23  Yes [provider]  cyclobenzaprine (FLEXERIL) 10 MG tablet Take 1 tablet (10 mg total) by mouth 2 (two) times daily as needed for muscle spasms. 12/02/23  Yes Zidan Helget, Lurena Joiner, PA-C  meclizine (ANTIVERT) 12.5 MG tablet Take 1 tablet (12.5 mg total) by mouth 3 (three) times daily as needed for dizziness. 12/02/23  Yes Daziah Hesler, Lurena Joiner,  PA-C  albuterol (VENTOLIN HFA) 108 (90 Base) MCG/ACT inhaler 2 puffs every 6 hours as needed for wheezing Patient not taking: Reported on 05/24/2023 09/24/21   Julieanne Manson, MD  Blood Pressure Monitor DEVI 1 kit by Does not apply route 2 (two) times daily as needed. Patient not taking: Reported on 11/18/2021 09/20/19   Grayce Sessions, NP  fluticasone [facility]) 50 MCG/ACT nasal spray Place 2 sprays into both nostrils daily. Patient not taking: Reported on 05/24/2023 09/24/21   Julieanne Manson, MD  fluticasone-salmeterol (ADVAIR DISKUS) 100-50 MCG/ACT AEPB Inhale 1 puff into the lungs 2 (two) times daily. Patient not taking: Reported on 05/24/2023 09/24/21   Julieanne Manson, MD  hydrochlorothiazide (HYDRODIURIL) 25 MG tablet Take 1 tablet (25 mg total) by mouth daily. Patient taking differently: Take 25 mg by mouth daily. 0.5 tab by mouth daily. 09/24/21   Julieanne Manson, MD  loratadine (CLARITIN) 10 MG tablet Take 1 tablet (10 mg total) by mouth daily. Patient not taking: Reported on 05/24/2023 09/24/21   Julieanne Manson, MD  ondansetron (ZOFRAN-ODT) 8 MG disintegrating tablet Take 1 tablet (8 mg total) by mouth every 8 (eight) hours as needed  for nausea or vomiting. 09/11/23   Merwyn Katos, MD  vitamin B-12 (CYANOCOBALAMIN) 1000 MCG tablet Take 1,000 mcg by mouth daily. Patient not taking: Reported on 11/18/2021    [provider]    Family History Family History  Family history unknown: Yes    Social History Social History   Tobacco Use   Smoking status: Some Days    Current packs/day: 0.50    Types: Cigarettes   Smokeless tobacco: Never  Vaping Use   Vaping status: Never Used  Substance Use Topics   Alcohol use: No   Drug use: No     Allergies   Aspirin   Review of Systems Review of Systems Per HPI  Physical Exam Triage Vital Signs ED Triage Vitals  Encounter Vitals Group     BP 12/02/23 1739 135/77     Systolic BP Percentile --       Diastolic BP Percentile --      Pulse Rate 12/02/23 1739 76     Resp 12/02/23 1739 16     Temp 12/02/23 1739 98.3 F (36.8 C)     Temp Source 12/02/23 1739 Oral     SpO2 12/02/23 1739 97 %     Weight --      Height --      Head Circumference --      Peak Flow --      Pain Score 12/02/23 1735 7     Pain Loc --      Pain Education --      Exclude from Growth Chart --    No data found.  Updated Vital Signs BP 135/77 (BP Location: Left Arm)   Pulse 76   Temp 98.3 F (36.8 C) (Oral)   Resp 16   SpO2 97%    Physical Exam Vitals and nursing note reviewed.  Constitutional:      General: She is not in acute distress. HENT:     Mouth/Throat:     Mouth: Mucous membranes are moist.     Pharynx: Oropharynx is clear.  Eyes:     General: Vision grossly intact. Gaze aligned appropriately.     Extraocular Movements: Extraocular movements intact.     Right eye: Normal extraocular motion and no nystagmus.     Left eye: Normal extraocular motion and no nystagmus.     Conjunctiva/sclera: Conjunctivae normal.     Pupils: Pupils are equal, round, and reactive to light.  Cardiovascular:     Rate and Rhythm: Normal rate and regular rhythm.     Heart sounds: Normal heart sounds.  Pulmonary:     Effort: Pulmonary effort is normal.     Breath sounds: Normal breath sounds.  Musculoskeletal:        General: Normal range of motion.     Cervical back: Normal range of motion. Spasms and tenderness present. No deformity, erythema, rigidity or bony tenderness. No pain with movement. Normal range of motion.     Thoracic back: No bony tenderness.     Lumbar back: No bony tenderness.       Back:     Comments: Muscular tenderness of the cervical paraspinals extending down the trapezius. Muscle tightness and spasm felt.  No bony tenderness at shoulders.  Full range of motion of neck and back.  Uses all extremities actively, grip strength and sensation intact throughout.  There is no bony tenderness  C-L-spine.  Skin:    General: Skin is warm and dry.  Neurological:  General: No focal deficit present.     Mental Status: She is alert and oriented to person, place, and time.     Cranial Nerves: Cranial nerves 2-12 are intact. No cranial nerve deficit.     Sensory: Sensation is intact.     Motor: Motor function is intact. No weakness or pronator drift.     Coordination: Coordination is intact.     Gait: Gait is intact.     Deep Tendon Reflexes: Reflexes are normal and symmetric.     Comments: Strength 5/5. Sensation intact throughout      UC Treatments / Results  Labs (all labs ordered are listed, but only abnormal results are displayed) Labs Reviewed - No data to display  EKG   Radiology No results found.  Procedures Procedures (including critical care time)  Medications Ordered in UC Medications  ketorolac (TORADOL) 30 MG/ML injection 30 mg (30 mg Intramuscular Given 12/02/23 1828)    Initial Impression / Assessment and Plan / UC Course  I have reviewed the triage vital signs and the nursing notes.  Pertinent labs & imaging results that were available during my care of the patient were reviewed by me and considered in my medical decision making (see chart for details).  Stable vitals, no red flags, neurologically intact No bony tenderness of neck or back  Discussed muscular etiology. IM toradol given for pain. Advised no NSAIDs for 24 hours. Try flexeril BID for tightness/spasm. Advised follow up with orthopedics if persisting.  Suspect head movement with motion is related to vertigo.  Try meclizine 3 times daily.  Again neurologically intact and no red flags.  Recommend monitoring symptoms.  For any acute worsening of either symptoms, strict ED precautions discussed.  I have also recommended she get herself set up with a primary care provider soon as possible.  Patient is agreeable to plan, no questions at this time  Final Clinical Impressions(s) / UC Diagnoses    Final diagnoses:  Neck pain  Muscular pain  Muscle tightness  Vertigo     Discharge Instructions      The Toradol injection given today should start to work in about 30 minutes. Please do not use any NSAIDs (ibuprofen/Advil, naproxen/Aleve, etc) for the next 12 hours. You can safely use tylenol.  You can take the muscle relaxer (Flexeril) twice daily. If the medication makes you drowsy, take only at bed time. Please call the orthopedic clinic to make a follow up appointment about your pain. Meclizine 1 tablet three times daily if needed for vertigo/dizziness You can scan the QR code on the last page to get established with a primary care provider  Please go to the emergency department if symptoms worsen  La inyeccin de Toradol que le administraron hoy debera empezar a Scientist, water quality en unos 30 minutos. No use ningn AINE (ibuprofeno/Advil, naproxeno/Aleve, etc.) durante las prximas 12 horas. Puede usar Tylenol sin problema. Puede tomar el relajante muscular (Flexeril) dos veces al da. Si el medicamento le produce somnolencia, tmelo solo antes de [place]. Llame a la clnica ortopdica para programar una cita de seguimiento sobre su dolor.  Meclizina: 1 tableta tres veces al da si es necesario para el vrtigo/mareo.  Puede escanear el cdigo QR en la ltima pgina para programar una cita con un mdico de cabecera. Si los sntomas empeoran, acuda a urgencias.     ED Prescriptions     Medication Sig Dispense Auth. Provider   meclizine (ANTIVERT) 12.5 MG tablet Take 1 tablet (12.5  mg total) by mouth 3 (three) times daily as needed for dizziness. 30 tablet Taiwo Fish, PA-C   cyclobenzaprine (FLEXERIL) 10 MG tablet Take 1 tablet (10 mg total) by mouth 2 (two) times daily as needed for muscle spasms. 20 tablet Tristin Vandeusen, Lurena Joiner, PA-C      PDMP not reviewed this encounter.   Kathrine Haddock 12/02/23 1610

## 2023-12-02 NOTE — ED Triage Notes (Addendum)
 Used interpretor Pt c/o neck and shoulder pain for 2 weeks that radiates to back. Taking Advil. Denies falls or injury, reports does heavy lifting at her job.  Pt states, "Also My mind feels drunk and I dont want to drive".  Pt adds that her eyes feel very heavy and tired.

## 2024-02-15 ENCOUNTER — Encounter (HOSPITAL_COMMUNITY): Payer: Self-pay

## 2024-02-15 ENCOUNTER — Ambulatory Visit (HOSPITAL_COMMUNITY)
Admission: EM | Admit: 2024-02-15 | Discharge: 2024-02-15 | Disposition: A | Discharge status: Home / Self Care | Attending: Family Medicine | Admitting: Family Medicine

## 2024-02-15 ENCOUNTER — Ambulatory Visit (INDEPENDENT_AMBULATORY_CARE_PROVIDER_SITE_OTHER)

## 2024-02-15 DIAGNOSIS — M722 Plantar fascial fibromatosis: Secondary | ICD-10-CM

## 2024-02-15 DIAGNOSIS — M79671 Pain in right foot: Secondary | ICD-10-CM

## 2024-02-15 MED ORDER — METHYLPREDNISOLONE 4 MG PO TBPK: ORAL_TABLET | ORAL | 0 refills | Status: DC

## 2024-02-15 NOTE — ED Provider Notes (Signed)
 [facility]   [id number] 02/15/24 Arrival Time: 1433  ASSESSMENT & PLAN:  1. Right foot pain   2. Plantar fasciitis of right foot    I have personally viewed and independently interpreted the imaging studies ordered this visit. R foot: no acute bony abnormalities appreciated.  See AVS for discharge information and appropriate exercised.  Discharge Medication List as of 02/15/2024  4:34 PM     START taking these medications   Details  methylPREDNISolone  (MEDROL  DOSEPAK) 4 MG TBPK tablet Take as directed., Normal        Orders Placed This Encounter  Procedures   DG Foot Complete Right   AMB referral to sports medicine   Work/school excuse note: provided. Recommend:  Follow-up Information     Schedule an appointment as soon as possible for a visit  with [facility].   Contact information: [address] [place]  [address] [phone]                Reviewed expectations re: course of current medical issues. Questions answered. Outlined signs and symptoms indicating need for more acute intervention. Patient verbalized understanding. After Visit Summary given.  SUBJECTIVE: History from: patient. Amber Crawford is a 58 y.o. female who reports R plantar foot pain; gradual onset; x 1 month; worse in am. Is ambulatory here. Denies extremity sensation changes or weakness. No tx PTA. Denies foot trauma.   Past Surgical History:  Procedure Laterality Date   CESAREAN SECTION     CHOLECYSTECTOMY        OBJECTIVE:   General appearance: alert; no distress Extremities: RLE: warm with well perfused appearance; well localized marked tenderness over right proximal planter foot; without gross deformities; swelling: none; bruising: none; ankle ROM: normal CV: brisk extremity capillary refill of RLE; 2+ DP pulse of RLE. Skin: warm and dry; no visible rashes Neurologic: gait normal; normal  sensation and strength of RLE Psychological: alert and cooperative; normal mood and affect  Imaging: DG Foot Complete Right Result Date: 02/15/2024 CLINICAL DATA:  Pain for 1 month. Worsening when walking. Ibuprofen  not providing relief. EXAM: RIGHT FOOT COMPLETE - 3+ VIEW COMPARISON:  None Available. FINDINGS: There is no evidence of fracture or dislocation. There is no evidence of arthropathy or other focal bone abnormality. Soft tissues are unremarkable. IMPRESSION: Negative. Electronically Signed   By: Norman Gatlin M.D.   On: 02/15/2024 16:12      Allergies  Allergen Reactions   Aspirin Other (See Comments)    Fingers get numb; OK to take acetaminophen  and IBU    Past Medical History:  Diagnosis Date   Essential hypertension 09/24/2021   Gastritis    Social History   Socioeconomic History   Marital status: Single    Spouse name: Not on file   Number of children: Not on file   Years of education: Not on file   Highest education level: Not on file  Occupational History   Not on file  Tobacco Use   Smoking status: Former    Current packs/day: 0.50    Types: Cigarettes   Smokeless tobacco: Never  Vaping Use   Vaping status: Every Day  Substance and Sexual Activity   Alcohol use: Yes    Comment: sometimes   Drug use: No   Sexual activity: Not on file  Other Topics Concern   Not on file  Social History Narrative   Not on file   Social Drivers of  Health   Financial Resource Strain: Not on file  Food Insecurity: Not on file  Transportation Needs: Not on file  Physical Activity: Not on file  Stress: Not on file  Social Connections: Not on file   Family History  Family history unknown: Yes   Past Surgical History:  Procedure Laterality Date   CESAREAN SECTION     CHOLECYSTECTOMY         Rolinda Rogue, MD 02/15/24 1754

## 2024-02-15 NOTE — ED Triage Notes (Signed)
 Patient here today with c/o right foot pain X 1 month. Patient has worsening pain when walking. She has been taking Ibuprofen  with no relief. No known injury.

## 2024-02-21 ENCOUNTER — Ambulatory Visit: Admitting: Family Medicine

## 2024-02-21 ENCOUNTER — Encounter: Payer: Self-pay | Admitting: Family Medicine

## 2024-02-21 VITALS — BP 130/84 | Ht 60.0 in | Wt 140.0 lb

## 2024-02-21 DIAGNOSIS — M722 Plantar fascial fibromatosis: Secondary | ICD-10-CM

## 2024-02-21 MED ORDER — MELOXICAM 15 MG PO TABS: 15.0000 mg | ORAL_TABLET | Freq: Every day | ORAL | 1 refills | Status: DC

## 2024-02-21 NOTE — Patient Instructions (Addendum)
 Tiene fascitis plantar. Tome meloxicam  15 mg al da con alimentos para Chief Technology Officer y la inflamacin; no tome Aleve  ni ibuprofeno mientras est tomando [employer]. Haga ejercicios y estiramientos en casa a diario. Aplique hielo en el taln durante 15 minutos segn sea necesario. Evite el calzado plano o caminar descalzo en la medida de lo posible. Use una faja para el arco plantar durante el da. Es importante usar plantillas (Dr. Farris Active Series, Spencos, nuestras plantillas verdes, plantillas ortopdicas personalizadas). La fisioterapia tambin es una opcin. Consulte conmigo en 5-6 semanas.

## 2024-02-21 NOTE — Progress Notes (Signed)
 PCP: Adella Norris, MD  Subjective:   HPI: Patient is a 58 y.o. female here for right foot pain.  Patient seen with interpreter present.  She reports about 1 month of plantar right foot pain. No injury or trauma. Tried change in shoes, inserts, rolling on a frozen water bottle. Pain worse with first steps in the morning. Tired prednisone  dose pack which did not help much.  Past Medical History:  Diagnosis Date   Essential hypertension 09/24/2021   Gastritis     Current Outpatient Medications on File Prior to Visit  Medication Sig Dispense Refill   Blood Pressure Monitor DEVI 1 kit by Does not apply route 2 (two) times daily as needed. (Patient not taking: Reported on 11/18/2021) 1 each bag   hydrochlorothiazide  (HYDRODIURIL ) 25 MG tablet Take 1 tablet (25 mg total) by mouth daily. (Patient taking differently: Take 25 mg by mouth daily. 0.5 tab by mouth daily.) 90 tablet 3   loratadine  (CLARITIN ) 10 MG tablet Take 1 tablet (10 mg total) by mouth daily. (Patient not taking: Reported on 05/24/2023) 30 tablet 11   methylPREDNISolone  (MEDROL  DOSEPAK) 4 MG TBPK tablet Take as directed. 1 each 0   vitamin B-12 (CYANOCOBALAMIN) 1000 MCG tablet Take 1,000 mcg by mouth daily. (Patient not taking: Reported on 11/18/2021)     No current facility-administered medications on file prior to visit.    Past Surgical History:  Procedure Laterality Date   CESAREAN SECTION     CHOLECYSTECTOMY      Allergies  Allergen Reactions   Aspirin Other (See Comments)    Fingers get numb; OK to take acetaminophen  and IBU    BP 130/84   Ht 5' (1.524 m)   Wt 140 lb (63.5 kg)   LMP  (LMP Unknown)   BMI 27.34 kg/m       No data to display              No data to display              Objective:  Physical Exam:  Gen: NAD, comfortable in exam room  Right foot/ankle: Pes cavus. No gross deformity, swelling, ecchymoses Full range of motion and strength Tenderness to palpation  plantar fascia insertion on medial calcaneus Negative ant drawer and negative talar tilt.   Negative calcaneal squeeze. NV intact distally.   Assessment & Plan:  1. Right plantar fasciitis - arch supports, arch binders.  Home exercises and stretches reviewed today.  Tylenol , meloxicam .  Icing if needed.  Follow up in 5-6 weeks.

## 2024-03-20 ENCOUNTER — Ambulatory Visit: Admitting: Family Medicine

## 2024-03-25 ENCOUNTER — Ambulatory Visit (HOSPITAL_COMMUNITY)
Admission: EM | Admit: 2024-03-25 | Discharge: 2024-03-25 | Disposition: A | Discharge status: Home / Self Care | Attending: Family Medicine | Admitting: Family Medicine

## 2024-03-25 ENCOUNTER — Encounter (HOSPITAL_COMMUNITY): Payer: Self-pay

## 2024-03-25 ENCOUNTER — Ambulatory Visit (INDEPENDENT_AMBULATORY_CARE_PROVIDER_SITE_OTHER)

## 2024-03-25 DIAGNOSIS — Z23 Encounter for immunization: Secondary | ICD-10-CM

## 2024-03-25 DIAGNOSIS — S61213A Laceration without foreign body of left middle finger without damage to nail, initial encounter: Secondary | ICD-10-CM

## 2024-03-25 MED ORDER — TETANUS-DIPHTH-ACELL PERTUSSIS 5-2.5-18.5 LF-MCG/0.5 IM SUSY
0.5000 mL | PREFILLED_SYRINGE | Freq: Once | INTRAMUSCULAR | Status: AC
  Administered 2024-03-25: 0.5 mL via INTRAMUSCULAR

## 2024-03-25 MED ORDER — LIDOCAINE HCL (PF) 1 % IJ SOLN
INTRAMUSCULAR | Status: AC
  Filled 2024-03-25: qty 2

## 2024-03-25 MED ORDER — TETANUS-DIPHTH-ACELL PERTUSSIS 5-2.5-18.5 LF-MCG/0.5 IM SUSY
PREFILLED_SYRINGE | INTRAMUSCULAR | Status: AC
  Filled 2024-03-25: qty 0.5

## 2024-03-25 MED ORDER — POVIDONE-IODINE 10 % EX SOLN
CUTANEOUS | Status: AC
  Filled 2024-03-25: qty 118

## 2024-03-25 NOTE — ED Triage Notes (Signed)
 Patient presenting with a laceration to the left middle finger onset last night when trying to open a bag of food with a knife. Unsure when last tetanus shot was. Denies being on blood thinners. States the left hand feels tingly.   Prescriptions or OTC medications tried: No

## 2024-03-25 NOTE — Discharge Instructions (Signed)

## 2024-03-25 NOTE — ED Provider Notes (Signed)
 [facility]    CSN: [id number] Arrival date & time: 03/25/24  1002      History   Chief Complaint Chief Complaint  Patient presents with   Finger Injury   Laceration    HPI Amber Crawford is a 58 y.o. female.   Amber Crawford is a 58 y.o. female presenting for chief complaint of Finger Injury and Laceration that happened last night at 7pm (approx 16 hours ago).  She was cleaning dishes when she cut her finger on a knife in the kitchen sink.  Laceration is linear over the PIP of the left middle finger.  Bled initially, bleeding controlled with pressure.  She does not take blood thinners.  Denies numbness and tingling distally to laceration.  She states the left middle finger feels tight when she attempts to bend the finger.  Denies previous injury to the left middle finger.  Last tetanus injection was in 2019.   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 Authorizing Provider  Blood Pressure Monitor DEVI 1 kit by Does not apply route 2 (two) times daily as needed. 09/20/19  Yes Celestia Rosaline SQUIBB, NP  hydrochlorothiazide  (HYDRODIURIL ) 25 MG tablet Take 1 tablet (25 mg total) by mouth daily. Patient taking differently: Take 25 mg by mouth daily. 0.5 tab by mouth daily. 09/24/21  Yes Adella Norris, MD  loratadine  (CLARITIN ) 10 MG tablet Take 1 tablet (10 mg total) by mouth daily. 09/24/21  Yes Adella Norris, MD  vitamin B-12 (CYANOCOBALAMIN) 1000 MCG tablet Take 1,000 mcg by mouth daily.   Yes [provider]    Family History Family History  Family history unknown: Yes    Social History Social History   Tobacco Use   Smoking status: Former    Current packs/day: 0.50    Types: Cigarettes   Smokeless tobacco: Never  Vaping Use   Vaping status: Every Day  Substance Use Topics   Alcohol use: Yes    Comment: sometimes   Drug use: No     Allergies   Aspirin   Review of Systems Review of Systems Per HPI  Physical Exam Triage Vital Signs ED Triage Vitals  Encounter Vitals Group     BP 03/25/24 1020 134/84     Girls Systolic BP Percentile --      Girls Diastolic BP Percentile --      Boys Systolic BP Percentile --      Boys Diastolic BP Percentile --  Pulse Rate 03/25/24 1020 71     Resp 03/25/24 1020 18     Temp 03/25/24 1020 98.9 F (37.2 C)     Temp Source 03/25/24 1020 Oral     SpO2 03/25/24 1020 97 %     Weight 03/25/24 1020 140 lb (63.5 kg)     Height 03/25/24 1020 5' (1.524 m)     Head Circumference --      Peak Flow --      Pain Score 03/25/24 1019 5     Pain Loc --      Pain Education --      Exclude from Growth Chart --    No data found.  Updated Vital Signs BP 134/84 (BP Location: Left Arm)   Pulse 71   Temp 98.9 F (37.2 C) (Oral)   Resp 18   Ht 5' (1.524 m)   Wt 140 lb (63.5 kg)   SpO2 97%   BMI 27.34 kg/m   Visual Acuity Right Eye Distance:   Left Eye Distance:   Bilateral Distance:    Right Eye Near:   Left Eye Near:    Bilateral Near:     Physical Exam Vitals and nursing note reviewed.  Constitutional:      Appearance: She is not ill-appearing or toxic-appearing.  HENT:     Head: Normocephalic and atraumatic.     Right Ear: Hearing and external ear normal.     Left Ear: Hearing and  external ear normal.     Nose: Nose normal.     Mouth/Throat:     Lips: Pink.  Eyes:     General: Lids are normal. Vision grossly intact. Gaze aligned appropriately.     Extraocular Movements: Extraocular movements intact.     Conjunctiva/sclera: Conjunctivae normal.  Pulmonary:     Effort: Pulmonary effort is normal.  Musculoskeletal:     Left hand: Laceration, tenderness (Laceration) and bony tenderness present. No swelling or deformity. Decreased range of motion (Decreased ROM of the left middle finger secondary to laceration over the PIP joint). Normal strength (5/5 strength against resistance with flexion and extension of the affected digit). Normal sensation (Normal sensation distally). There is no disruption of two-point discrimination. Normal capillary refill. Normal pulse (+2 left radial pulse).     Cervical back: Neck supple.  Skin:    General: Skin is warm and dry.     Capillary Refill: Capillary refill takes less than 2 seconds.     Findings: Laceration (1.5 to 2 cm laceration between the PIP and the DIP joint of the dorsal left middle finger) present. No rash.     Comments: See image of laceration below.  Neurological:     General: No focal deficit present.     Mental Status: She is alert and oriented to person, place, and time. Mental status is at baseline.     Cranial Nerves: No dysarthria or facial asymmetry.  Psychiatric:        Mood and Affect: Mood normal.        Speech: Speech normal.        Behavior: Behavior normal.        Thought Content: Thought content normal.        Judgment: Judgment normal.    Left middle finger laceration   Laceration of left middle finger status post repair   UC Treatments / Results  Labs (all labs ordered are listed, but only abnormal results are displayed) Labs Reviewed - No data to  display  EKG   Radiology DG Finger Middle Left Result Date: 03/25/2024 EXAM: 3 VIEW(S) XRAY OF THE LEFT FINGER(S) 03/25/2024 11:10:22 AM  COMPARISON: None available. CLINICAL HISTORY: Laceration over the left PIP middle finger. Patient presenting with a laceration to the left middle finger onset last night when trying to open a bag of food with a knife. Unsure when last tetanus shot was. Denies being on blood thinners. States the left hand feels tingly. FINDINGS: BONES AND JOINTS: No acute fracture. No joint dislocation. SOFT TISSUES: Soft tissue swelling about the middle phalanx. No radiopaque foreign bodies within the soft tissues. IMPRESSION: 1. No acute fracture or dislocation. 2. Soft tissue swelling about the middle phalanx, likely related to the reported laceration. No radiopaque foreign body. Electronically signed by: Waddell Calk MD 03/25/2024 11:19 AM EDT RP Workstation: [id number]    Procedures Laceration Repair  Date/Time: 03/25/2024 11:50 AM  Performed by: Enedelia Dorna HERO, FNP Authorized by: Vonna Sharlet POUR, MD   Consent:    Consent obtained:  Verbal   Consent given by:  Patient   Risks, benefits, and alternatives were discussed: yes     Risks discussed:  Infection, need for additional repair, nerve damage, pain, poor wound healing, poor cosmetic result, retained foreign body, tendon damage and vascular damage   Alternatives discussed:  No treatment Anesthesia:    Anesthesia method:  Local infiltration   Local anesthetic:  Lidocaine  1% w/o epi Laceration details:    Location:  Finger   Finger location:  L long finger   Length (cm):  2   Depth (mm):  5 Exploration:    Imaging obtained: x-ray     Imaging outcome: foreign body not noted     Wound exploration: wound explored through full range of motion   Treatment:    Area cleansed with:  Povidone-iodine  and Shur-Clens   Amount of cleaning:  Standard Skin repair:    Repair method:  Sutures   Suture size:  5-0   Suture material:  Prolene   Suture technique:  Simple interrupted   Number of sutures:  3 Approximation:    Approximation:  Close Repair  type:    Repair type:  Simple Post-procedure details:    Dressing:  Non-adherent dressing   Procedure completion:  Tolerated well, no immediate complications  (including critical care time)  Medications Ordered in UC Medications  Tdap (BOOSTRIX ) injection 0.5 mL (0.5 mLs Intramuscular Given 03/25/24 1045)    Initial Impression / Assessment and Plan / UC Course  I have reviewed the triage vital signs and the nursing notes.  Pertinent labs & imaging results that were available during my care of the patient were reviewed by me and considered in my medical decision making (see chart for details).   1.  Laceration of left middle finger without foreign body without damage to nail, need for tetanus booster Laceration repaired, see procedure note above for details. Discussed wound care and cleaning at home.  Imaging:  Infection return precautions discussed.  Suture removal in 10 days.  Tdap is given in clinic today.  Tylenol  as needed for pain at home.  Advised to rest and avoid activities that may increase tension to wound/sutures or expose wound to infection. Excuse note given.   Counseled patient on potential for adverse effects with medications prescribed/recommended today, strict ER and return-to-clinic precautions discussed, patient verbalized understanding.    Final Clinical Impressions(s) / UC Diagnoses   Final diagnoses:  Laceration of left middle finger without foreign  body without damage to nail, initial encounter  Need for tetanus booster     Discharge Instructions      Wound care: Please keep the area surrounding the wound/sutures clean and dry for the next 24 hours. After 24 hours, you may get the wound wet. Gently clean wound with antibacterial soap. Do not scrub wound. Cover the area with a nonstick bandage and change the bandage 2 times a day.   You should have the sutures removed in 10 days by your primary care provider or at urgent care. Return sooner than 10 days  if you experience discharge from your laceration, redness around your laceration, warmth around your laceration, or fever.   You may take over the counter medicines as needed for aches and pains once the numbing wears off.   Thanks for letting me fix your cut today!      ED Prescriptions   None    PDMP not reviewed this encounter.   Enedelia Dorna HERO, [place] 03/25/24 1153

## 2024-04-04 ENCOUNTER — Encounter (HOSPITAL_COMMUNITY): Payer: Self-pay | Admitting: Emergency Medicine

## 2024-04-04 ENCOUNTER — Ambulatory Visit (HOSPITAL_COMMUNITY): Admission: EM | Admit: 2024-04-04 | Discharge: 2024-04-04 | Disposition: A | Discharge status: Home / Self Care

## 2024-04-04 DIAGNOSIS — Z4802 Encounter for removal of sutures: Secondary | ICD-10-CM

## 2024-04-04 NOTE — ED Triage Notes (Signed)
 Daughter helping translate. Pt reports 2 weeks ago was here and got stitches in left middle finger. Denies any complications. Here to get 3 stitches removed.

## 2024-04-04 NOTE — Discharge Instructions (Signed)
 Patient presents for suture removal. The wound is well healed without signs of infection.  The sutures are removed. Wound care and activity instructions given. Return prn.

## 2024-07-24 ENCOUNTER — Ambulatory Visit: Admitting: Family Medicine

## 2024-07-24 VITALS — BP 130/88 | Ht 60.0 in | Wt 140.0 lb

## 2024-07-24 DIAGNOSIS — M722 Plantar fascial fibromatosis: Secondary | ICD-10-CM

## 2024-07-24 MED ORDER — MELOXICAM 15 MG PO TABS: 15.0000 mg | ORAL_TABLET | Freq: Every day | ORAL | 1 refills | Status: AC

## 2024-07-24 NOTE — Patient Instructions (Addendum)
 Tiene fascitis plantar. Tome meloxicam  con alimentos segn sea necesario para el dolor y la inflamacin. Comience fisioterapia y haga ejercicios en casa los das que no vaya a terapia. Aplique hielo en el taln durante 15 minutos segn sea necesario. Evite usar zapatos planos o caminar descalzo siempre que sea posible. Si las plantillas deportivas no le [address], incluso con la talla correcta, llmenos para que podamos hacer algunos ajustes. La terapia de ondas de choque es una opcin, pero no est cubierta por el seguro. Consulte conmigo en 6 semanas.

## 2024-07-24 NOTE — Progress Notes (Signed)
 PCP: Adella Norris, MD  Patient is a 58 y.o. female here for bilateral heel pain.  HPI  Interpreter present for today's visit.  Patient was last seen in June 2025 for right heel plantar fasciitis with Dr. Cleatrice.  About 2 months ago, patient reports that her left foot began hurting in a similar fashion. Pain in her right heel persists as well. Pain today is located on plantar surface of heel, now bilateral (left in addition to right prior) She notices the pain the most after sitting for a period of time and standing up.  Patient is on her feet all day as a housekeeper. Patient received arch supports at her June visit, but the arch felt too big and it did not fit in her shoes properly and hurt more when used. Patient felt like her insoles were the wrong size because she was wearing her sandals when she came instead of her tennis shoes. She reports she did get some relief from meloxicam . She has not been icing.   Past Medical History:  Diagnosis Date   Essential hypertension 09/24/2021   Gastritis     Current Outpatient Medications on File Prior to Visit  Medication Sig Dispense Refill   Blood Pressure Monitor DEVI 1 kit by Does not apply route 2 (two) times daily as needed. 1 each bag   hydrochlorothiazide  (HYDRODIURIL ) 25 MG tablet Take 1 tablet (25 mg total) by mouth daily. (Patient taking differently: Take 25 mg by mouth daily. 0.5 tab by mouth daily.) 90 tablet 3   loratadine  (CLARITIN ) 10 MG tablet Take 1 tablet (10 mg total) by mouth daily. 30 tablet 11   vitamin B-12 (CYANOCOBALAMIN) 1000 MCG tablet Take 1,000 mcg by mouth daily.     No current facility-administered medications on file prior to visit.    Past Surgical History:  Procedure Laterality Date   CESAREAN SECTION     CHOLECYSTECTOMY      Allergies  Allergen Reactions   Aspirin Other (See Comments)    Fingers get numb; OK to take acetaminophen  and IBU    BP 130/88   Ht 5' (1.524 m)   Wt 140 lb  (63.5 kg)   BMI 27.34 kg/m       No data to display              No data to display              Objective:  Physical Exam:  Gen: NAD, comfortable in exam room  Bilateral feet/ankles: - Inspection: No visible lesions, edema, erythema, overlying skin changes - Palpation: Pain on palpation of plantar medial tubercle plantar fascial insertion site - ROM: Normal active ROM including inversion and eversion - Strength: 5/5 dorsiflexion and plantarflexion bilaterally - Special Tests: Negative calcaneal squeeze, negative anterior drawer test - Neurovascular: 2+ DP and PT pulses, normal sensation of toes  Assessment and Plan:   Bilateral plantar fasciitis without use of PT exercises due to pain and without use of insoles due to incorrect sizing. - Provided new insoles, sized to shoes worn today, call back if issues with new sizing - Recommended [place] socks at night, provided information - Ambulatory referral to physical therapy to assist with tolerance of exercises - Continue acetaminophen , refilled meloxicam  - Continue ice as needed - Follow-up in 6 weeks

## 2024-08-05 ENCOUNTER — Ambulatory Visit (HOSPITAL_COMMUNITY)

## 2024-08-05 ENCOUNTER — Encounter (HOSPITAL_COMMUNITY): Payer: Self-pay

## 2024-08-05 ENCOUNTER — Other Ambulatory Visit: Payer: Self-pay

## 2024-08-05 ENCOUNTER — Ambulatory Visit (HOSPITAL_COMMUNITY)
Admission: EM | Admit: 2024-08-05 | Discharge: 2024-08-05 | Disposition: A | Discharge status: Home / Self Care | Source: Home / Self Care

## 2024-08-05 DIAGNOSIS — R051 Acute cough: Secondary | ICD-10-CM | POA: Diagnosis not present

## 2024-08-05 DIAGNOSIS — J209 Acute bronchitis, unspecified: Secondary | ICD-10-CM

## 2024-08-05 LAB — POC COVID19/FLU A&B COMBO
Covid Antigen, POC: NEGATIVE
Influenza A Antigen, POC: NEGATIVE
Influenza B Antigen, POC: NEGATIVE

## 2024-08-05 MED ORDER — PREDNISONE 20 MG PO TABS: 40.0000 mg | ORAL_TABLET | Freq: Every day | ORAL | 0 refills | Status: AC

## 2024-08-05 MED ORDER — IPRATROPIUM-ALBUTEROL 0.5-2.5 (3) MG/3ML IN SOLN
3.0000 mL | Freq: Once | RESPIRATORY_TRACT | Status: AC
  Administered 2024-08-05: 3 mL via RESPIRATORY_TRACT

## 2024-08-05 MED ORDER — ALBUTEROL SULFATE HFA 108 (90 BASE) MCG/ACT IN AERS: 1.0000 | INHALATION_SPRAY | Freq: Four times a day (QID) | RESPIRATORY_TRACT | 0 refills | Status: AC | PRN

## 2024-08-05 MED ORDER — IPRATROPIUM-ALBUTEROL 0.5-2.5 (3) MG/3ML IN SOLN
RESPIRATORY_TRACT | Status: AC
  Filled 2024-08-05: qty 3

## 2024-08-05 NOTE — Discharge Instructions (Addendum)
 Su radiografa de trax no revel ninguna neumona subyacente y su electrocardiograma parece normal hoy. Creo que sus sntomas probablemente se deban a una bronquitis aguda. Comience a tomar dos comprimidos de prednisona una vez al da durante 5 das para [employer] sntomas. Puede usar therapist, nutritional de albuterol , de 1 a 2 inhalaciones cada 4 a 6 horas, segn sea necesario, para la opresin en el pecho, las sibilancias o la dificultad para respirar. Tambin puede tomar Mucinex  de venta libre para la tos y la congestin. Consulte con su mdico de cabecera o regrese aqu si es necesario. Si presenta empeoramiento del dolor en el pecho, dificultad para respirar, debilidad intensa o fiebre alta persistente, busque atencin mdica de inmediato en el servicio de urgencias.  Your chest x-ray did not reveal any underlying pneumonia, and your EKG appears to be normal today. I believe your symptoms are likely related to an acute bronchitis. Start taking 2 tablets of prednisone  once daily for 5 days for relief of symptoms. You can use albuterol  inhaler 1 to 2 puffs every 4-6 hours as needed for chest tightness, wheezing, or shortness of breath. You can take over-the-counter Mucinex  for cough and congestion as well. Follow-up with primary care provider or return here as needed. If you develop worsening chest pain, difficulty breathing, severe weakness, or persistent high fever please seek immediate medical treatment in the emergency department.

## 2024-08-05 NOTE — ED Provider Notes (Signed)
 [facility]    CSN: [id number] Arrival date & time: 08/05/24  1426      History   Chief Complaint Chief Complaint  Patient presents with   Chest Pain    HPI Daniela Siebers de Manuel is a 58 y.o. female.   Patient presents with intermittent central chest pain that radiates to her back that began 3 days ago.  Patient states that she has also had an intermittent mild dry cough over the last few days.  Patient states that she feels like she is having difficulty taking a deep breath due to to the chest discomfort.   Patient states that she also feels like she has some congestion in her chest.  Patient also reports noticing some nasal congestion for about 1 week.  Patient denies any cough prior to the chest pain that began 3 days ago.  Patient denies fever, body aches, chills, and weakness.  Patient reports that she has been taking Tylenol  with minimal relief. Patient denies any known sick exposures.  Past medical history includes essential hypertension and gastritis.  Patient reports that she does take her blood pressure medication daily as prescribed.  Denies any history of asthma or COPD.  The history is provided by the patient and medical records. The history is limited by a language barrier. No language interpreter was used (Declined interpreter, patient prefers that daughter interprets).  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 Authorizing Provider  albuterol  (VENTOLIN  HFA) 108 (90 Base) MCG/ACT inhaler Inhale 1-2 puffs into the lungs every 6 (six) hours as needed for wheezing or shortness of breath. 08/05/24  Yes Johnie, Meelah Tallo A, NP  predniSONE  (DELTASONE ) 20 MG tablet Take 2 tablets (40 mg total) by mouth daily for 5 days. 08/05/24 08/10/24 Yes Johnie Flaming A, NP  Blood Pressure Monitor DEVI 1 kit by Does not apply route 2 (two) times daily as needed. 09/20/19   Celestia Rosaline SQUIBB, NP  hydrochlorothiazide  (HYDRODIURIL ) 25 MG tablet Take 1 tablet (25 mg total) by mouth daily. Patient taking differently: Take 25 mg by mouth daily. 0.5 tab by mouth daily. 09/24/21   Adella Norris, MD  loratadine  (CLARITIN ) 10 MG tablet Take 1 tablet (10 mg total) by mouth daily. 09/24/21   Adella Norris, MD  meloxicam  (MOBIC ) 15 MG tablet Take 1 tablet (15 mg total) by mouth daily. 07/24/24   Hudnall, Ludie SAUNDERS, MD  vitamin B-12 (CYANOCOBALAMIN) 1000 MCG tablet Take 1,000 mcg by mouth daily. Patient not taking: Reported on 08/05/2024  [provider]    Family History Family History  Family history unknown: Yes    Social History Social History[1]   Allergies   Aspirin   Review of Systems Review of Systems  Cardiovascular:  Positive for chest pain.   Per HPI  Physical Exam Triage Vital Signs ED Triage Vitals [08/05/24 1543]  Encounter Vitals Group     BP 116/69     Girls Systolic BP Percentile      Girls Diastolic BP Percentile      Boys Systolic BP Percentile      Boys Diastolic BP Percentile      Pulse Rate 71     Resp 16     Temp 99.4 F (37.4 C)     Temp Source Oral     SpO2 96 %     Weight      Height      Head Circumference      Peak Flow      Pain Score 0     Pain Loc      Pain Education      Exclude from Growth Chart    No data found.  Updated  Vital Signs BP 116/69 (BP Location: Right Arm)   Pulse 71   Temp 99.4 F (37.4 C) (Oral)   Resp 16   LMP  (LMP Unknown)   SpO2 96%   Visual Acuity Right Eye Distance:   Left Eye Distance:   Bilateral Distance:    Right Eye Near:   Left Eye Near:    Bilateral Near:     Physical Exam Vitals and nursing note reviewed.  Constitutional:      General: She is awake. She is not in acute distress.    Appearance: Normal appearance. She is well-developed and well-groomed. She is ill-appearing. She is not toxic-appearing or diaphoretic.  HENT:     Right Ear: Tympanic membrane, ear canal and external ear normal.     Left Ear: Tympanic membrane, ear canal and external ear normal.     Nose: Congestion and rhinorrhea present.     Mouth/Throat:     Mouth: Mucous membranes are moist.     Pharynx: Oropharynx is clear. Posterior oropharyngeal erythema and postnasal drip present. No oropharyngeal exudate.     Tonsils: No tonsillar exudate.  Cardiovascular:     Rate and Rhythm: Normal rate and regular rhythm.  Pulmonary:     Effort: Pulmonary effort is normal.     Breath sounds: Examination of the right-upper field reveals rhonchi. Rhonchi present.  Chest:     Chest wall: No tenderness.  Musculoskeletal:     Thoracic back: Normal. No tenderness.     Lumbar back: Normal.  Skin:    General: Skin is warm and dry.  Neurological:     General: No focal deficit present.     Mental Status: She is alert and oriented to person, place, and time. Mental status is at baseline.  Psychiatric:        Behavior: Behavior is cooperative.      UC Treatments / Results  Labs (all labs ordered are listed, but only abnormal results are displayed) Labs Reviewed  POC COVID19/FLU A&B COMBO    EKG   Radiology DG Chest 2 View Result Date: 08/05/2024 CLINICAL DATA:  Dry cough, chest pain, and chest congestion. EXAM: CHEST - 2 VIEW COMPARISON:  08/29/2020. FINDINGS: The heart size and mediastinal  contours are within normal limits. No consolidation, effusion, or pneumothorax is  seen. No acute osseous abnormality. Surgical clips are noted in the right upper quadrant. IMPRESSION: No active cardiopulmonary disease. Electronically Signed   By: Leita Birmingham M.D.   On: 08/05/2024 16:32    Procedures Procedures (including critical care time)  Medications Ordered in UC Medications  ipratropium-albuterol  (DUONEB) 0.5-2.5 (3) MG/3ML nebulizer solution 3 mL (3 mLs Nebulization Given 08/05/24 1624)    Initial Impression / Assessment and Plan / UC Course  I have reviewed the triage vital signs and the nursing notes.  Pertinent labs & imaging results that were available during my care of the patient were reviewed by me and considered in my medical decision making (see chart for details).     Patient is mildly ill-appearing.  Vitals are stable.  COVID and flu testing both negative.  EKG unremarkable.  Chest x-ray ordered to rule out any underlying pneumonia related to symptoms.  I independently interpreted these images and there is no active cardiopulmonary disease at this time.  Radiology report confirms this.  Given DuoNeb in clinic with significant relief of symptoms as well as relief of rhonchi.  Suspect that symptoms could be related to a viral bronchitis.  Prescribed short course of prednisone  for this.  Prescribed albuterol  inhaler for additional relief of symptoms.  Discussed over-the-counter medications for symptoms.  Discussed follow-up, return, and strict ER precautions. Final Clinical Impressions(s) / UC Diagnoses   Final diagnoses:  Acute cough  Acute bronchitis, unspecified organism     Discharge Instructions      Su radiografa de trax no revel ninguna neumona subyacente y su electrocardiograma parece normal hoy. Creo que sus sntomas probablemente se deban a una bronquitis aguda. Comience a tomar dos comprimidos de prednisona una vez al da durante 5 das para [employer] sntomas. Puede usar therapist, nutritional de albuterol , de 1 a 2 inhalaciones cada 4 a 6 horas, segn sea necesario, para la opresin en el pecho, las sibilancias o la dificultad para respirar. Tambin puede tomar Mucinex  de venta libre para la tos y la congestin. Consulte con su mdico de cabecera o regrese aqu si es necesario. Si presenta empeoramiento del dolor en el pecho, dificultad para respirar, debilidad intensa o fiebre alta persistente, busque atencin mdica de inmediato en el servicio de urgencias.  Your chest x-ray did not reveal any underlying pneumonia, and your EKG appears to be normal today. I believe your symptoms are likely related to an acute bronchitis. Start taking 2 tablets of prednisone  once daily for 5 days for relief of symptoms. You can use albuterol  inhaler 1 to 2 puffs every 4-6 hours as needed for chest tightness, wheezing, or shortness of breath. You can take over-the-counter Mucinex  for cough and congestion as well. Follow-up with primary care provider or return here as needed. If you develop worsening chest pain, difficulty breathing, severe weakness, or persistent high fever please seek immediate medical treatment in the emergency department.     ED Prescriptions     Medication Sig Dispense Auth. Provider   albuterol  (VENTOLIN  HFA) 108 (90 Base) MCG/ACT inhaler Inhale 1-2 puffs into the lungs every 6 (six) hours as needed for wheezing or shortness of breath. 34 g Johnie Flaming A, NP   predniSONE  (DELTASONE ) 20 MG tablet Take 2 tablets (40 mg total) by mouth daily for 5 days. 10 tablet Johnie Flaming A, NP      PDMP not reviewed this encounter.    [1]  Social History Tobacco Use   Smoking status: Former  Current packs/day: 0.50    Types: Cigarettes   Smokeless tobacco: Never  Vaping Use   Vaping status: Every Day   Substances: Nicotine, Flavoring  Substance Use Topics   Alcohol use: Yes    Comment: sometimes   Drug use: No      Johnie Rumaldo LABOR, NP 08/05/24 1711

## 2024-08-05 NOTE — ED Triage Notes (Signed)
 Patient c/o intermittent mid chest pain that radiates into the mid back x 3 days. Patient reports intermittent cough,but denies dizziness and nausea. Patient states at times it is hard to take a deep breath.  Patient states she has been taking Tylenol .  Patient offered an interpreter, but states 'I want my daughter to do it.

## 2024-09-04 ENCOUNTER — Ambulatory Visit: Admitting: Family Medicine
